# Patient Record
Sex: Female | Born: 1950 | Race: Black or African American | Hispanic: No | Smoking: Never smoker
Health system: Southern US, Community
[De-identification: ages and names within clinical notes are randomized; demographics above are authoritative.]

## PROBLEM LIST (undated history)

## (undated) DIAGNOSIS — I639 Cerebral infarction, unspecified: Secondary | ICD-10-CM

## (undated) DIAGNOSIS — A523 Neurosyphilis, unspecified: Secondary | ICD-10-CM

## (undated) HISTORY — PX: TUBAL LIGATION: SHX77

---

## 2013-10-17 ENCOUNTER — Inpatient Hospital Stay: Payer: Self-pay | Admitting: Internal Medicine

## 2013-10-17 LAB — PROTIME-INR
INR: 1.1
Prothrombin Time: 14.6 secs (ref 11.5–14.7)

## 2013-10-17 LAB — CBC WITH DIFFERENTIAL/PLATELET
Basophil #: 0.1 10*3/uL (ref 0.0–0.1)
Basophil %: 0.9 %
Eosinophil %: 1.1 %
HCT: 35.1 % (ref 35.0–47.0)
HGB: 11.6 g/dL — ABNORMAL LOW (ref 12.0–16.0)
Lymphocyte #: 2.5 10*3/uL (ref 1.0–3.6)
Lymphocyte %: 21.2 %
MCH: 29.2 pg (ref 26.0–34.0)
Monocyte #: 0.7 x10 3/mm (ref 0.2–0.9)
Monocyte %: 5.8 %
Neutrophil #: 8.3 10*3/uL — ABNORMAL HIGH (ref 1.4–6.5)
Neutrophil %: 71 %
RDW: 13.3 % (ref 11.5–14.5)
WBC: 11.7 10*3/uL — ABNORMAL HIGH (ref 3.6–11.0)

## 2013-10-17 LAB — URINALYSIS, COMPLETE
Bilirubin,UR: NEGATIVE
Ph: 7 (ref 4.5–8.0)
Protein: NEGATIVE
RBC,UR: 35 /HPF (ref 0–5)
Squamous Epithelial: 11
WBC UR: 98 /HPF (ref 0–5)

## 2013-10-17 LAB — BASIC METABOLIC PANEL
BUN: 13 mg/dL (ref 7–18)
Chloride: 105 mmol/L (ref 98–107)
Creatinine: 0.71 mg/dL (ref 0.60–1.30)
EGFR (Non-African Amer.): >60
Glucose: 105 mg/dL — ABNORMAL HIGH (ref 65–99)
Potassium: 3.8 mmol/L (ref 3.5–5.1)
Sodium: 139 mmol/L (ref 136–145)

## 2013-10-17 LAB — APTT: Activated PTT: 25.9 secs (ref 23.6–35.9)

## 2013-10-18 LAB — COMPREHENSIVE METABOLIC PANEL
Alkaline Phosphatase: 48 U/L
Anion Gap: 7 (ref 7–16)
BUN: 11 mg/dL (ref 7–18)
Chloride: 108 mmol/L — ABNORMAL HIGH (ref 98–107)
Creatinine: 0.55 mg/dL — ABNORMAL LOW (ref 0.60–1.30)
Potassium: 3.9 mmol/L (ref 3.5–5.1)
SGOT(AST): 14 U/L — ABNORMAL LOW (ref 15–37)
Sodium: 141 mmol/L (ref 136–145)
Total Protein: 6.2 g/dL — ABNORMAL LOW (ref 6.4–8.2)

## 2013-10-18 LAB — LIPID PANEL
Ldl Cholesterol, Calc: 53 mg/dL (ref 0–100)
VLDL Cholesterol, Calc: 6 mg/dL (ref 5–40)

## 2013-10-18 LAB — CBC WITH DIFFERENTIAL/PLATELET
Eosinophil #: 0.1 10*3/uL (ref 0.0–0.7)
Eosinophil %: 1.1 %
HCT: 30 % — ABNORMAL LOW (ref 35.0–47.0)
Lymphocyte #: 2 10*3/uL (ref 1.0–3.6)
MCH: 29.6 pg (ref 26.0–34.0)
MCHC: 33.8 g/dL (ref 32.0–36.0)
MCV: 88 fL (ref 80–100)
Monocyte #: 0.6 x10 3/mm (ref 0.2–0.9)
Neutrophil #: 6.5 10*3/uL (ref 1.4–6.5)
Platelet: 258 10*3/uL (ref 150–440)
RDW: 13.5 % (ref 11.5–14.5)
WBC: 9.2 10*3/uL (ref 3.6–11.0)

## 2013-10-23 ENCOUNTER — Encounter: Payer: Self-pay | Admitting: Neurology

## 2015-03-08 NOTE — Discharge Summary (Signed)
PATIENT NAME:  Autumn Pruitt, Haillie MR#:  [id number] DATE OF BIRTH:  1951/09/16  DATE OF ADMISSION:  10/17/2013 DATE OF DISCHARGE:  10/20/2013  DISCHARGE DIAGNOSES: 1.  Acute cerebrovascular accident, status post TPA.  2.  Urinary tract infection.   DISCHARGE MEDICATIONS: 1.  Aspirin 81 mg daily. 2.  Atorvastatin 40 mg by mouth daily.  3.  Cipro 500 mg every 12 hours for 10 days.   CONSULTATIONS:  Neurology consult, Dr. Sherryll BurgerShah.  Physical therapy consult and speech therapy consult.   HOSPITAL COURSE:  This is a 64 year old African American with no past medical history came in because of left upper extremity weakness, numbness, speech difficulty and facial weakness which started at 3:00 p.m. at work today.  The patient had a stroke scale of 5 and the patient received TPA in the Emergency Room and admitted to ICU.  Initial CAT scan did not show any hemorrhage.  The patient was monitored in the ICU for 24 hours.  The patient's symptoms nicely improved off the TPA.  The patient's repeat CAT scan is done because she complained of headache, but CT head did not show any hemorrhage.  The patient was started on aspirin after 24 hours and the patient also had an echocardiogram, MRI of the brain along with carotid ultrasound and lipid panel.  The patient has slight slurred speech and mild left-sided weakness.  The patient's EKG showed normal sinus rhythm 83 beats per minute.  EKG is not showing any atrial fibrillation.  The patient's LDL 53 and cholesterol total 127, creatinine is 0.55.  She had a carotid ultrasound which showed minimal plaque in the left carotid bulb.  No evidence of hemodynamically significant stenosis.  MRI of the brain without contrast showed no acute infarct.  No hemorrhage.  MRA showed plaque-like narrowing superior margin of the mid to distal MI segment of the right middle cerebral artery.  Carotid ultrasound as I mentioned.  Echocardiogram showed EF more than 60%, normal LV function, normal  ventricular wall thickness.  The patient received speech therapy while in the hospital along with physical therapy.  The patient got an appointment to receive speech therapy for one time as an outpatient and also outpatient physical therapy setup.  The patient had UTI on UA on admission so started on Cipro and the patient also seen by the physical therapy and occupation therapy.  The patient went home in stable condition.    DISCHARGE VITAL SIGNS:  Temperature 98 Fahrenheit, heart rate 82, blood pressure 117/60 with sats 98% on room air.   Time spent on discharging the patient more than 30 minutes.   The patient advised to follow up with neurologist Dr. Cristopher PeruHemang Shah.   ____________________________ Katha HammingSnehalatha Glennis Borger, MD sk:ea D: 10/27/2013 23:42:50 ET T: 10/28/2013 00:25:45 ET JOB#: [id number]  cc: Katha HammingSnehalatha Safir Michalec, MD, <Dictator> Katha HammingSNEHALATHA Auron Tadros MD ELECTRONICALLY SIGNED 10/30/2013 13:48

## 2015-03-08 NOTE — Consult Note (Signed)
Referring Physician:  Dustin Flock H :   Reason for Consult: Admit Date: 17-Oct-2013  Chief Complaint: left sided weakness   History of Present Illness: History of Present Illness:   Autumn Pruitt had acute onset of numbness of lt hand with weakness at 3 pm today. Pt was at work - called family member - who brought pt to ER. Family also has noted left face weakness. pt was not able to walk (family mentioned - she had inverted feet in car), pt very anxious and nervous. No recent dehydration, head trauma, social change, TIA. No h/o migraine with aura or use of OC pills. at bedside 5. I personally reviewed CT head - neg for hemorrhage, no other contraindication for tPA - disucssed risk and decided to give tPA.  ROS:  General denies complaints   HEENT no complaints   Lungs no complaints   Cardiac no complaints   GI no complaints   GU no complaints   Musculoskeletal no complaints   Extremities no complaints   Skin no complaints   Neuro numbness/tingling  left hand weakness   Endocrine no complaints   Psych anxious, nervous and tremulous   Past Medical/Surgical Hx:  anemia:   tubal ligation:   Past Medical/ Surgical Hx:  Past Medical History neg   Past Surgical History neg   Allergies:  No Known Allergies:   Allergies:  Allergies NKDA   Social/Family History: Social History: Does not smoke. Does not drink. No drugs, lives byherslef, works at a [employer] in [place]  Family History: There is a history of stroke in her daughter.   Vital Signs: **Vital Signs.:   02-Dec-14 19:54  Vital Signs Type Admission  Temperature Temperature (F) 98.7  Celsius 37  Pulse Pulse 90  Respirations Respirations 22  Systolic BP Systolic BP 858  Diastolic BP (mmHg) Diastolic BP (mmHg) 78  Mean BP 89  Pulse Ox % Pulse Ox % 85  Pulse Ox Activity Level  At rest  Oxygen Delivery Room Air/ 21 %  Pulse Ox Heart Rate 86   Physical Exam: General: Well developed,  HEENT:  Normocephalic, atraumatic, no lesions or abnormalities noted.  Neck: Supple with full ROM; no masses, nodes or bruits;  Chest: Clear to auscultation bilaterally with good air movement.  Normal respiratory effort.  No wheezing or rhonchi.  Cardiac: Regular rate and rhythm without murmur, rub, or gallop.  Extremities: No clubbing, cyanosis, or edema.   EXAM: Fundoscopic exam through undilated pupils was OK  Neurological Exam      Mental Status:      Alert, very anxious, tremulous,     Oriented to time, place, person and situation     Attention span and concentration seemed appropriate     Memory seemed OK.     Intact Comprehension but has some decreased fluency (was not able to read NIHSS cards)     Followed 2 step commands - mild dysarthria     Fund of knowledge seemed appropriate for age and health status.       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      Finger rub  was heard symmetric in both ears      Palate and uvular movements are normal and oral sensations are OK      Neck muscle strength and shoulder shrug is normal      Tongue protrusion and uvular elevation are normal       Motor Exam:      Tone is normal in all extremities left UE - hand had cortical weakness, apraxia, +ve pronator drift.      No abnormal movements, fasciculations or atrophy seen       Deep Tendon Reflexes:      symmetric 2 +      Right Toes are down going,  Left Toes are down going            Sensory Exam:      Sensations were intact to light touch in all extremities except left UE - numbess            Co-ordination:      Finger to nose is normal            Gait:      Gait and station are normal      Romberg's test is  negative.  Lab Results: Routine Chem:  02-Dec-14 16:30   Glucose, Serum  105  BUN 13  Creatinine (comp) 0.71  Sodium, Serum 139  Potassium, Serum 3.8  Chloride, Serum 105  CO2, Serum 28  Calcium (Total), Serum 9.5  Anion Gap  6  Osmolality (calc) 278  eGFR (African American) >60  eGFR (Non-African American) >60 (eGFR values <27m/min/1.73 m2 may be an indication of chronic kidney disease (CKD). Calculated eGFR is useful in patients with stable renal function. The eGFR calculation will not be reliable in acutely ill patients when serum creatinine is changing rapidly. It is not useful in  patients on dialysis. The eGFR calculation may not be applicable to patients at the low and high extremes of body sizes, pregnant women, and vegetarians.)  Cardiac:  02-Dec-14 16:30   Troponin I < 0.02 (0.00-0.05 0.05 ng/mL or less: NEGATIVE  Repeat testing in 3-6 hrs  if clinically indicated. >0.05 ng/mL: POTENTIAL  MYOCARDIAL INJURY. Repeat  testing in 3-6 hrs if  clinically indicated. NOTE: An increase or decrease  of 30% or more on serial  testing suggests a  clinically important change)  Routine Coag:  02-Dec-14 16:30   Prothrombin 14.6  INR 1.1 (INR reference interval applies to patients on anticoagulant therapy. A single INR therapeutic range for coumarins is not optimal for all indications; however, the suggested range for most indications is 2.0 - 3.0. Exceptions to the INR Reference Range may include: Prosthetic heart valves, acute myocardial infarction, prevention of myocardial infarction, and combinations of aspirin and anticoagulant. The need for a higher or lower target INR must be assessed individually. Reference: The Pharmacology and Management of the Vitamin K  antagonists: the seventh ACCP Conference on Antithrombotic and Thrombolytic Therapy. CAJGOT.1572Sept:126 (3suppl): [id number] A HCT value >55% may artifactually increase the PT.  In one study,  the increase  was an average of 25%. Reference:  "Effect on Routine and Special Coagulation Testing Values of Citrate Anticoagulant Adjustment in Patients with High HCT Values." American Journal of Clinical Pathology 2006;126:400-405.)  Activated PTT (APTT) 25.9 (A HCT value >55% may artifactually increase the APTT. In one study, the increase was an average of 19%. Reference: "Effect on Routine and Special Coagulation Testing Values of Citrate Anticoagulant Adjustment in Patients with High HCT  Values." American Journal of Clinical Pathology 5284;132:440-102.)  Routine Hem:  02-Dec-14 16:30   WBC (CBC)  11.7  RBC (CBC) 3.99  Hemoglobin (CBC)  11.6  Hematocrit (CBC) 35.1  Platelet Count (CBC) 322  MCV 88  MCH 29.2  MCHC 33.1  RDW 13.3  Neutrophil % 71.0  Lymphocyte % 21.2  Monocyte % 5.8  Eosinophil % 1.1  Basophil % 0.9  Neutrophil #  8.3  Lymphocyte # 2.5  Monocyte # 0.7  Eosinophil # 0.1  Basophil # 0.1 (Result(s) reported on 17 Oct 2013 at 04:47PM.)   Radiology Results: CT:    02-Dec-14 17:15, CT Head Without Contrast  CT Head Without Contrast   REASON FOR EXAM:    left arm numbness, slurred speech  COMMENTS:       PROCEDURE: CT  - CT HEAD WITHOUT CONTRAST  - Oct 17 2013  5:15PM     CLINICAL DATA:  Left arm numbness, slurred speech    EXAM:  CT HEAD WITHOUT CONTRAST    TECHNIQUE:  Contiguousaxial images were obtained from the base of the skull  through the vertex without intravenous contrast.    COMPARISON:  None.  FINDINGS:  No skull fracture is noted. Paranasal sinuses and mastoid air cells  are unremarkable.    No intracranial hemorrhage, mass effect or midline shift. A  partially calcified subcutaneous nodule is noted in right parietal  region high convexity. Measures 1.4 cm. May represent a partially  calcified sebaceous cyst. Clinical correlation is necessary.    No acute cortical infarction. No intracranial mass lesion is noted  on this unenhanced scan.      IMPRESSION:  No acute intracranial abnormality.  Electronically Signed    By: Lahoma Crocker M.D.    On: 10/17/2013 17:17         Verified By: Ephraim Hamburger, M.D.,   Impression/Recommendations: Recommendations:   64 yo AAF with no significant medical history - developed acute left UE weakness/numbness, speech difficulty, left facial weakness, slurred speech and difficulty walking at 3 pm at work today.CT head reviewed - no hemorrhage, no early signs of ischemia or dense MCA sign.NIHSS 5 ( 1 - mild face weakness, 1 left UE weakness, 1 left UE sensory, 1 aphasia, 1 dysarthria). Pt has baseline right eye exotropia.anxious - tremulousdiscussed tPA - family and patient understands the risk but I believe benifit is more - we decided to give it to her.pt will be admitted to ICU per protocol per hospitalist service, i will follow. Avoid non-compressible lines other invasive procedures if possible.Stroke: Previous antiplatelet agent - NONE - after 24 hr start ASA 81 mg po qday.vessel ultrasound (carotid and vertebrals):monitoring: to look for arrythmia as potential cause of infractlipid profile:HgA1c:swallow eval. avoid extreme  hypo/hyper tension and gentle BP reducation in peristroke period to maintain perfusion in ischemic penumbra. (160/90 with MAP 110. Consider using short acting meds with short half life, avoid NTG and nitropruside due to chance of increasing intracranial pressure)treat fever early (hyperthermia impairs stroke recovery), aggressive PT/OT/ST, early bedside swallow evaluation. Consider statin  (Lipid lowering and non lipid lowering effects of statin are useful in secondary stroke prevention. - atorvastatin 40 mg/dayDVT prophylaxis Consider flu vaccine and multivitamines.  spent total 45 min for this critically ill pt with life threatening/limb threatening severe neurological condition (including tPA decision), more than 50% of the time was for counselling and co-ordination of the care.    will follow.  Electronic Signatures: Autumn Pruitt (MD)  (  Signed 02-Dec-14 20:27)  Authored: REFERRING PHYSICIAN, Consult, History of Present Illness, Review of Systems, PAST MEDICAL/SURGICAL HISTORY, ALLERGIES, Social/Family History, NURSING VITAL SIGNS, Physical Exam-, LAB RESULTS, RADIOLOGY RESULTS, Recommendations   Last Updated: 02-Dec-14 20:27 by Autumn Pruitt (MD)

## 2015-03-08 NOTE — H&P (Signed)
PATIENT NAME:  Autumn Pruitt, Autumn Pruitt MR#:  [id number] DATE OF BIRTH:  09/30/1951  DATE OF ADMISSION:  10/17/2013  PRIMARY CARE PROVIDER: None.   REFERRING PHYSICIAN: Dr. Sharma Covert.   CHIEF COMPLAINT: Left upper extremity weakness, numbness, slurred speech, as well as left facial weakness.   HISTORY OF PRESENT ILLNESS: The patient is a 64 year old African American female with no prior medical history who presents with symptoms of left-sided facial weakness, speech difficulty, left upper extremity weakness and numbness that started at 3 p.m. at work today. The patient came to the ED and had a CT scan of the head which was negative for hemorrhage. No early signs of ischemia or any other abnormality. At that point, a stat neurology evaluation was done. Was seen by Dr. Sherryll Burger and he put her on NIH scale of 5. TPA was discussed with the patient, and she was given TPA and now we are asked to admit the patient. The patient reports some of the symptoms have improved very minimally. She was also very anxious and continues to be anxious and tremulous. She reports that she has never had a TIA or a stroke in the past and is not taking any medications or has any medical problems.   PAST MEDICAL HISTORY: Significant for none.    PAST SURGICAL HISTORY: None according to her.   ALLERGIES: None.   MEDICATIONS: She is not on any medications.   SOCIAL HISTORY: Does not smoke. Does not drink. No drugs.   FAMILY HISTORY: There is a history of stroke in her daughter.   REVIEW OF SYSTEMS:   CONSTITUTIONAL: Denies any fevers, fatigue, weakness, pain, weight loss or weight gain.  EYES: No blurred or double vision. No pain. No redness. No inflammation. No glaucoma. No cataracts.  ENT: No tinnitus. No ear pain. No hearing loss. No seasonal or year-round allergies. No epistaxis. No discharge. No postnasal drip. No sinus pain. No difficulty swallowing.  RESPIRATORY: Denies any cough, wheezing, hemoptysis. No dyspnea.   CARDIOVASCULAR: Denies any chest pain, orthopnea, edema or arrhythmia. No palpitations. No syncope.  GASTROINTESTINAL: No nausea, vomiting, diarrhea. No abdominal pain. No hematemesis. No melena.  GENITOURINARY: Denies any dysuria, hematuria, renal calculus or frequency.  ENDOCRINE: Denies any polyuria, nocturia or thyroid problems.  HEMATOLOGIC AND LYMPHATIC: Denies anemia, easy bruisability or bleeding.  SKIN: No acne. No rash. No changes in mole, hair or skin.  MUSCULOSKELETAL: Denies any pain in the neck, back or shoulder.  NEUROLOGIC: No  seizures  No CVA. No TIA previously.  PSYCHIATRIC: No axniety depression  insomnia or ADD.   PHYSICAL EXAMINATION:  VITAL SIGNS: Temperature 98.5, pulse 81, respirations 18, blood pressure 134/67, O2 98%.  GENERAL: The patient is an anxious female.  HEENT: Head atraumatic, normocephalic. Pupils equally round, react to light and accommodation. She does have right eye exotropia which limits evaluation of her extraocular movements. There is no conjunctival pallor. No scleral icterus. Nasal exam shows no drainage or ulceration. Oropharynx is clear without any exudate.  NECK: Supple without any JVD.  CARDIOVASCULAR: Regular rate and rhythm. No murmurs, rubs, clicks or gallops. PMI is not displaced.  LUNGS: Clear to auscultation bilaterally without any rales, rhonchi, wheezing.  ABDOMEN: Soft, nontender, nondistended. Positive bowel sounds x 4. No hepatosplenomegaly.  GENITOURINARY: Deferred.  MUSCULOSKELETAL: There is no erythema or swelling.  SKIN: No rash.  LYMPHATICS: No lymph nodes palpable.  VASCULAR: Good DP, PT pulses.  PSYCHIATRIC: A little anxious.  NEUROLOGIC: She has a mild asymmetry to her left  face. Diminished sensation to the left face. The rest of the cranial nerves are normal. Strength is diminished in the left upper and left lower extremities. It is 4 out of 5. Reflexes are hyperreflexic on the left side. Sensation intact in the extremity.    EVALUATIONS: In the ED, glucose 105, BUN 13, creatinine 0.71, sodium 139, potassium 3.8, chloride 104, CO2 is 28, calcium 9.5. Troponin less than 0.02. WBC 11.7, hemoglobin 11.6, platelet count 322. EKG showed normal sinus rhythm without any ST-T wave changes. CT scan of the head shows no acute intracranial abnormalities.   ASSESSMENT AND PLAN: The patient is a 64 year old [place] female with no medical history. Developed acute left upper extremity weakness, numbness, left facial weakness. Thought to have acute cerebrovascular accident with [employer] Score of 5. Was seen by neurology earlier.  1. Acute cerebrovascular accident: At this time, she has received TPA. Will admit the patient for further evaluation to the intensive care unit in light of receiving TPA. Will monitor her very closely. The patient will not be placed on any antiplatelets. This will need to be started tomorrow. We will start her on pravastatin. We will get a swallow evaluation. Physical therapy and occupational evaluation. Will get carotid Dopplers. Will get echocardiogram of the heart. Will get an MRI of the brain. Will also allow permissive hypertension unless her blood pressure is extremely high.  2. In light of receiving the TPA, will hold off on Lovenox for deep vein thrombosis prophylaxis. She will be on sequential compression devices.   TOTAL TIME SPENT ON THIS PATIENT: 55 minutes.   ____________________________ Lacie ScottsShreyang H. Allena KatzPatel, MD shp:gb D: 10/17/2013 19:01:42 ET T: 10/17/2013 19:16:56 ET JOB#: [id number]  cc: Akiem Urieta H. Allena KatzPatel, MD, <Dictator> Charise CarwinSHREYANG H Amaree Leeper MD ELECTRONICALLY SIGNED 10/27/2013 12:46

## 2015-03-08 NOTE — Consult Note (Signed)
Brief Consult Note: Diagnosis: code stroke.   Patient was seen by consultant.   Consult note dictated.   Comments: - STAT neurology consult was called in this 64 yo AAF with no significant medical history - developed acute left UE weakness/numbness, speech difficulty, left facial weakness, slurred speech and difficulty walking at 3 pm at work today. - CT head reviewed - no hemorrhage, no early signs of ischemia or dense MCA sign. - NIHSS 5 ( 1 - mild face weakness, 1 left UE weakness, 1 left UE sensory, 1 aphasia, 1 dysarthria). Pt has baseline right eye exotropia. - anxious - tremulous - discussed tPA - family and patient understands the risk but I believe benifit is more - we decided to give it to her. - pt will be admitted to ICU per protocol per hospitalist service, i will follow.  - Avoid non-compressible lines other invasive procedures if possible. - Stroke: Previous antiplatelet agent - NONE - after 24 hr start ASA 81 mg po qday. ECHO: Extracranial vessel ultrasound (carotid and vertebrals): Telemtry monitoring: to look for arrythmia as potential cause of infract Fasting lipid profile: UDS, HgA1c: - swallow eval.  Treatment:  - avoid extreme  hypo/hyper tension and gentle BP reducation in peristroke period to maintain perfusion in ischemic penumbra. (160/90 with MAP 110. Consider using short acting meds with short half life, avoid NTG and nitropruside due to chance of increasing intracranial pressure) - treat fever early (hyperthermia impairs stroke recovery),  - aggressive PT/OT/ST, early bedside swallow evaluation.  - Consider statin  (Lipid lowering and non lipid lowering effects of statin are useful in secondary stroke prevention. - atorvastatin 40 mg/day - DVT prophylaxis  - Consider flu vaccine and multivitamines.    - will follow.  Electronic Signatures: Jolene ProvostShah, Himani Corona Kalpeshkumar (MD)  (Signed 02-Dec-14 17:50)  Authored: Brief Consult Note   Last Updated: 02-Dec-14  17:50 by Jolene ProvostShah, Richy Spradley Kalpeshkumar (MD)

## 2015-05-25 ENCOUNTER — Emergency Department: Payer: Self-pay

## 2015-05-25 ENCOUNTER — Emergency Department
Admission: EM | Admit: 2015-05-25 | Discharge: 2015-05-25 | Disposition: A | Discharge status: Home / Self Care | Payer: Self-pay | Attending: Emergency Medicine | Admitting: Emergency Medicine

## 2015-05-25 ENCOUNTER — Encounter: Payer: Self-pay | Admitting: Emergency Medicine

## 2015-05-25 ENCOUNTER — Other Ambulatory Visit: Payer: Self-pay

## 2015-05-25 DIAGNOSIS — E876 Hypokalemia: Secondary | ICD-10-CM | POA: Insufficient documentation

## 2015-05-25 DIAGNOSIS — N390 Urinary tract infection, site not specified: Secondary | ICD-10-CM | POA: Insufficient documentation

## 2015-05-25 DIAGNOSIS — G4700 Insomnia, unspecified: Secondary | ICD-10-CM | POA: Insufficient documentation

## 2015-05-25 HISTORY — DX: Cerebral infarction, unspecified: I63.9

## 2015-05-25 LAB — URINALYSIS COMPLETE WITH MICROSCOPIC (ARMC ONLY)
Bilirubin Urine: NEGATIVE
Glucose, UA: NEGATIVE mg/dL
Nitrite: NEGATIVE
Protein, ur: NEGATIVE mg/dL
SPECIFIC GRAVITY, URINE: 1.008 (ref 1.005–1.030)
pH: 5 (ref 5.0–8.0)

## 2015-05-25 LAB — COMPREHENSIVE METABOLIC PANEL
ALK PHOS: 46 U/L (ref 38–126)
ALT: 15 U/L (ref 14–54)
ANION GAP: 10 (ref 5–15)
AST: 23 U/L (ref 15–41)
Albumin: 4.4 g/dL (ref 3.5–5.0)
BILIRUBIN TOTAL: 0.7 mg/dL (ref 0.3–1.2)
BUN: 10 mg/dL (ref 6–20)
CHLORIDE: 105 mmol/L (ref 101–111)
CO2: 27 mmol/L (ref 22–32)
CREATININE: 0.7 mg/dL (ref 0.44–1.00)
Calcium: 9.4 mg/dL (ref 8.9–10.3)
GFR calc Af Amer: >60 mL/min (ref >60)
GFR calc non Af Amer: >60 mL/min (ref >60)
Glucose, Bld: 111 mg/dL — ABNORMAL HIGH (ref 65–99)
POTASSIUM: 2.9 mmol/L — AB (ref 3.5–5.1)
Sodium: 142 mmol/L (ref 135–145)
Total Protein: 7.8 g/dL (ref 6.5–8.1)

## 2015-05-25 LAB — CBC
HCT: 35.5 % (ref 35.0–47.0)
Hemoglobin: 11.9 g/dL — ABNORMAL LOW (ref 12.0–16.0)
MCH: 29.5 pg (ref 26.0–34.0)
MCHC: 33.6 g/dL (ref 32.0–36.0)
MCV: 87.8 fL (ref 80.0–100.0)
PLATELETS: 289 10*3/uL (ref 150–440)
RBC: 4.05 MIL/uL (ref 3.80–5.20)
RDW: 14.3 % (ref 11.5–14.5)
WBC: 8.2 10*3/uL (ref 3.6–11.0)

## 2015-05-25 MED ORDER — LORAZEPAM 1 MG PO TABS
ORAL_TABLET | ORAL | Status: AC
  Filled 2015-05-25: qty 1

## 2015-05-25 MED ORDER — ASPIRIN EC 81 MG PO TBEC: 81.0000 mg | DELAYED_RELEASE_TABLET | Freq: Every day | ORAL | Status: AC

## 2015-05-25 MED ORDER — POTASSIUM CHLORIDE 20 MEQ/15ML (10%) PO SOLN
40.0000 meq | Freq: Once | ORAL | Status: DC
  Filled 2015-05-25: qty 30

## 2015-05-25 MED ORDER — RISPERIDONE 1 MG PO TABS: 0.5000 mg | ORAL_TABLET | Freq: Two times a day (BID) | ORAL | Status: DC

## 2015-05-25 MED ORDER — CEPHALEXIN 500 MG PO CAPS: 500.0000 mg | ORAL_CAPSULE | Freq: Four times a day (QID) | ORAL | Status: DC

## 2015-05-25 MED ORDER — CEFTRIAXONE SODIUM 1 G IJ SOLR
INTRAMUSCULAR | Status: AC
  Filled 2015-05-25: qty 10

## 2015-05-25 MED ORDER — POTASSIUM CHLORIDE CRYS ER 20 MEQ PO TBCR: 40.0000 meq | EXTENDED_RELEASE_TABLET | Freq: Once | ORAL | Status: DC

## 2015-05-25 MED ORDER — TRAZODONE HCL 50 MG PO TABS: 50.0000 mg | ORAL_TABLET | Freq: Every day | ORAL | Status: DC

## 2015-05-25 MED ORDER — CEFTRIAXONE SODIUM IN DEXTROSE 20 MG/ML IV SOLN
1.0000 g | INTRAVENOUS | Status: DC
Start: 2015-05-25 — End: 2015-05-25

## 2015-05-25 MED ORDER — ASPIRIN 81 MG PO CHEW
81.0000 mg | CHEWABLE_TABLET | Freq: Once | ORAL | Status: AC
  Administered 2015-05-25: 81 mg via ORAL

## 2015-05-25 MED ORDER — ASPIRIN 81 MG PO CHEW
CHEWABLE_TABLET | ORAL | Status: AC
  Filled 2015-05-25: qty 1

## 2015-05-25 MED ORDER — POTASSIUM CHLORIDE 20 MEQ PO PACK
PACK | ORAL | Status: AC
  Administered 2015-05-25: 40 meq
  Filled 2015-05-25: qty 2

## 2015-05-25 MED ORDER — LORAZEPAM 1 MG PO TABS
1.0000 mg | ORAL_TABLET | Freq: Once | ORAL | Status: AC
  Administered 2015-05-25: 1 mg via ORAL

## 2015-05-25 MED ORDER — POTASSIUM CHLORIDE CRYS ER 20 MEQ PO TBCR
EXTENDED_RELEASE_TABLET | ORAL | Status: AC
  Filled 2015-05-25: qty 2

## 2015-05-25 MED ORDER — POTASSIUM CHLORIDE ER 10 MEQ PO TBCR: 10.0000 meq | EXTENDED_RELEASE_TABLET | Freq: Two times a day (BID) | ORAL | Status: DC

## 2015-05-25 MED ORDER — CEFTRIAXONE SODIUM 1 G IJ SOLR
1.0000 g | Freq: Once | INTRAMUSCULAR | Status: AC
  Administered 2015-05-25: 1 g via INTRAMUSCULAR

## 2015-05-25 NOTE — ED Notes (Signed)
Patient given lunch tray to eat.

## 2015-05-25 NOTE — ED Notes (Signed)
Patient to ED via EMS with c/o insomina and altered mental status. Per EMS, patient neighbor called 911 stating patient has been acting " different". Per EMS patient has been calling the police on children in the neighborhood playing basketball. Patient is anxious at assessment. 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 Patient is alert. Patient is able to follow instructions and answer questions. Patient denies HI/ SI. Patient is unable to state how many days she has been without sleep. Patient states she has called to police on the children playing basketball due to " they are loud, that's all I hear".

## 2015-05-25 NOTE — [facility] Counselor (Signed)
Dr. Guss Bundehalla asked writer to call Pt's daughter for assistance with Psych Exam; spoke with Pt's daughter by telephone who stated that she was en route to the hospital and would meet staff in her mother's room

## 2015-05-25 NOTE — ED Provider Notes (Addendum)
-----------------------------------------   5:08 PM on 05/25/2015 -----------------------------------------   BP 128/104 mmHg  Pulse 87  Temp(Src) 98 F (36.7 C) (Oral)  Resp 17  Ht 5\' 9"  (1.753 m)  Wt 130 lb (58.968 kg)  BMI 19.19 kg/m2  SpO2 100%  Dr. Guss Bundehalla evaluated the patient in person and feel she is appropriate for outpatient follow-up.  She recommended trazodone 50 mg daily at bedtime and Risperdal 0.5 mg twice a day.  Additionally, I am treating her for her urinary tract infection which may help with her mental status.  The patient and her daughter agree that these steps should help and the patient will follow up with her outpatient doctor.  I am going to discharge her for outpatient follow-up.  I gave my usual and customary return precautions.  I discussed this plan with the patient and her daughter and they both understand and agree.   New Prescriptions   ASPIRIN EC 81 MG TABLET    Take 1 tablet (81 mg total) by mouth daily.   CEPHALEXIN (KEFLEX) 500 MG CAPSULE    Take 1 capsule (500 mg total) by mouth 4 (four) times daily.   POTASSIUM CHLORIDE (K-DUR) 10 MEQ TABLET    Take 1 tablet (10 mEq total) by mouth 2 (two) times daily.   RISPERIDONE (RISPERDAL) 1 MG TABLET    Take 0.5 tablets (0.5 mg total) by mouth 2 (two) times daily.   TRAZODONE (DESYREL) 50 MG TABLET    Take 1 tablet (50 mg total) by mouth at bedtime.     Loleta Roseory Iliany Losier, MD 05/25/15 [phone]

## 2015-05-25 NOTE — Discharge Instructions (Signed)
Please note that because of your history of stroke, it is important that you stay on a baby aspirin daily. We noted that you are longer taking any medications, this is very important to prevent stroke as you have a previous history of stroke. Please follow-up with your doctor right away to reestablish care and obtain close follow-up.  As we discussed, the psychiatrist recommended that you take the prescribed medications (risperidone and trazodone) as prescribed to help with your sleeping situation.  We have also prescribed for you and antibiotics (cephalexin) for urinary tract infection.  We also prescribed a few tablets of potassium supplement.  You should definitely follow up with your doctor as soon as possible.  Return to the emergency room right away should you develop confusion, headache, fever, chills, vomiting, feel you may pass out, be at risk of hurting herself or others become very anxious other new concerns arise. Insomnia Insomnia is frequent trouble falling and/or staying asleep. Insomnia can be a long term problem or a short term problem. Both are common. Insomnia can be a short term problem when the wakefulness is related to a certain stress or worry. Long term insomnia is often related to ongoing stress during waking hours and/or poor sleeping habits. Overtime, sleep deprivation itself can make the problem worse. Every little thing feels more severe because you are overtired and your ability to cope is decreased. CAUSES   Stress, anxiety, and depression.  Poor sleeping habits.  Distractions such as TV in the bedroom.  Naps close to bedtime.  Engaging in emotionally charged conversations before bed.  Technical reading before sleep.  Alcohol and other sedatives. They may make the problem worse. They can hurt normal sleep patterns and normal dream activity.  Stimulants such as caffeine for several hours prior to bedtime.  Pain syndromes and shortness of breath can cause  insomnia.  Exercise late at night.  Changing time zones may cause sleeping problems (jet lag). It is sometimes helpful to have someone observe your sleeping patterns. They should look for periods of not breathing during the night (sleep apnea). They should also look to see how long those periods last. If you live alone or observers are uncertain, you can also be observed at a sleep clinic where your sleep patterns will be professionally monitored. Sleep apnea requires a checkup and treatment. Give your caregivers your medical history. Give your caregivers observations your family has made about your sleep.  SYMPTOMS   Not feeling rested in the morning.  Anxiety and restlessness at bedtime.  Difficulty falling and staying asleep. TREATMENT   Your caregiver may prescribe treatment for an underlying medical disorders. Your caregiver can give advice or help if you are using alcohol or other drugs for self-medication. Treatment of underlying problems will usually eliminate insomnia problems.  Medications can be prescribed for short time use. They are generally not recommended for lengthy use.  Over-the-counter sleep medicines are not recommended for lengthy use. They can be habit forming.  You can promote easier sleeping by making lifestyle changes such as:  Using relaxation techniques that help with breathing and reduce muscle tension.  Exercising earlier in the day.  Changing your diet and the time of your last meal. No night time snacks.  Establish a regular time to go to bed.  Counseling can help with stressful problems and worry.  Soothing music and white noise may be helpful if there are background noises you cannot remove.  Stop tedious detailed work at least one hour  before bedtime. HOME CARE INSTRUCTIONS   Keep a diary. Inform your caregiver about your progress. This includes any medication side effects. See your caregiver regularly. Take note of:  Times when you are  asleep.  Times when you are awake during the night.  The quality of your sleep.  How you feel the next day. This information will help your caregiver care for you.  Get out of bed if you are still awake after 15 minutes. Read or do some quiet activity. Keep the lights down. Wait until you feel sleepy and go back to bed.  Keep regular sleeping and waking hours. Avoid naps.  Exercise regularly.  Avoid distractions at bedtime. Distractions include watching television or engaging in any intense or detailed activity like attempting to balance the household checkbook.  Develop a bedtime ritual. Keep a familiar routine of bathing, brushing your teeth, climbing into bed at the same time each night, listening to soothing music. Routines increase the success of falling to sleep faster.  Use relaxation techniques. This can be using breathing and muscle tension release routines. It can also include visualizing peaceful scenes. You can also help control troubling or intruding thoughts by keeping your mind occupied with boring or repetitive thoughts like the old concept of counting sheep. You can make it more creative like imagining planting one beautiful flower after another in your backyard garden.  During your day, work to eliminate stress. When this is not possible use some of the previous suggestions to help reduce the anxiety that accompanies stressful situations. MAKE SURE YOU:   Understand these instructions.  Will watch your condition.  Will get help right away if you are not doing well or get worse. Document Released: 10/30/2000 Document Revised: 01/25/2012 Document Reviewed: 11/30/2007 [facility] Patient Information 2015 [place]. This information is not intended to replace advice given to you by your health care provider. Make sure you discuss any questions you have with your health care provider.   Urinary Tract Infection Urinary tract infections (UTIs) can develop anywhere  along your urinary tract. Your urinary tract is your body's drainage system for removing wastes and extra water. Your urinary tract includes two kidneys, two ureters, a bladder, and a urethra. Your kidneys are a pair of bean-shaped organs. Each kidney is about the size of your fist. They are located below your ribs, one on each side of your spine. CAUSES Infections are caused by microbes, which are microscopic organisms, including fungi, viruses, and bacteria. These organisms are so small that they can only be seen through a microscope. Bacteria are the microbes that most commonly cause UTIs. SYMPTOMS  Symptoms of UTIs may vary by age and gender of the patient and by the location of the infection. Symptoms in young women typically include a frequent and intense urge to urinate and a painful, burning feeling in the bladder or urethra during urination. Older women and men are more likely to be tired, shaky, and weak and have muscle aches and abdominal pain. A fever may mean the infection is in your kidneys. Other symptoms of a kidney infection include pain in your back or sides below the ribs, nausea, and vomiting. DIAGNOSIS To diagnose a UTI, your caregiver will ask you about your symptoms. Your caregiver also will ask to provide a urine sample. The urine sample will be tested for bacteria and white blood cells. White blood cells are made by your body to help fight infection. TREATMENT  Typically, UTIs can be treated with medication.  Because most UTIs are caused by a bacterial infection, they usually can be treated with the use of antibiotics. The choice of antibiotic and length of treatment depend on your symptoms and the type of bacteria causing your infection. HOME CARE INSTRUCTIONS  If you were prescribed antibiotics, take them exactly as your caregiver instructs you. Finish the medication even if you feel better after you have only taken some of the medication.  Drink enough water and fluids to keep  your urine clear or pale yellow.  Avoid caffeine, tea, and carbonated beverages. They tend to irritate your bladder.  Empty your bladder often. Avoid holding urine for long periods of time.  Empty your bladder before and after sexual intercourse.  After a bowel movement, women should cleanse from front to back. Use each tissue only once. SEEK MEDICAL CARE IF:   You have back pain.  You develop a fever.  Your symptoms do not begin to resolve within 3 days. SEEK IMMEDIATE MEDICAL CARE IF:   You have severe back pain or lower abdominal pain.  You develop chills.  You have nausea or vomiting.  You have continued burning or discomfort with urination. MAKE SURE YOU:   Understand these instructions.  Will watch your condition.  Will get help right away if you are not doing well or get worse. Document Released: 08/12/2005 Document Revised: 05/03/2012 Document Reviewed: 12/11/2011 [facility] Patient Information 2015 [place], [place]. This information is not intended to replace advice given to you by your health care provider. Make sure you discuss any questions you have with your health care provider.

## 2015-05-25 NOTE — Consult Note (Signed)
Autumn Pruitt   Reason for Pruitt: Follow up Referring Physician:  [facility] Patient Identification: Autumn Pruitt MRN:  [id number] Principal Diagnosis: <principal problem not specified> Diagnosis:  There are no active problems to display for this patient.   Total Time spent with patient: 1 hour  Subjective:   Autumn Pruitt is a 64 y.o. female patient admitted with a H/O CVA in 2015 and is widowed and lives by herself and has neighborhood kids that are making noises that she is not able to get a good night rest and made a complaint and came to ER for help.Marland Kitchen  HPI:  As above. Lives by herself and loves herself HPI Elements:     Past Medical History:  Past Medical History  Diagnosis Date  . CVA (cerebral infarction)    History reviewed. No pertinent past surgical history. Family History: No family history on file. Social History:  History  Alcohol Use No     History  Drug Use Not on file    History   Social History  . Marital Status: Single    Spouse Name: N/A  . Number of Children: N/A  . Years of Education: N/A   Social History Main Topics  . Smoking status: Unknown If Ever Smoked  . Smokeless tobacco: Not on file  . Alcohol Use: No  . Drug Use: Not on file  . Sexual Activity: Not on file   Other Topics Concern  . None   Social History Narrative  . None   Additional Social History:    History of alcohol / drug use?: No history of alcohol / drug abuse                     Allergies:  No Known Allergies  Labs:  Results for orders placed or performed during the hospital encounter of 05/25/15 (from the past 48 hour(s))  CBC     Status: Abnormal   Collection Time: 05/25/15 12:55 PM  Result Value Ref Range   WBC 8.2 3.6 - 11.0 K/uL   RBC 4.05 3.80 - 5.20 MIL/uL   Hemoglobin 11.9 (L) 12.0 - 16.0 g/dL   HCT 35.5 35.0 - 47.0 %   MCV 87.8 80.0 - 100.0 fL   MCH 29.5 26.0 - 34.0 pg   MCHC 33.6 32.0 - 36.0 g/dL   RDW 14.3 11.5 - 14.5 %    Platelets 289 150 - 440 K/uL  Comprehensive metabolic panel     Status: Abnormal   Collection Time: 05/25/15 12:55 PM  Result Value Ref Range   Sodium 142 135 - 145 mmol/L   Potassium 2.9 (LL) 3.5 - 5.1 mmol/L    Comment: CRITICAL RESULT CALLED TO, READ BACK BY AND VERIFIED WITH JENNIFER JONES @ 1347 ON 05/25/2015 CAF    Chloride 105 101 - 111 mmol/L   CO2 27 22 - 32 mmol/L   Glucose, Bld 111 (H) 65 - 99 mg/dL   BUN 10 6 - 20 mg/dL   Creatinine, Ser 0.70 0.44 - 1.00 mg/dL   Calcium 9.4 8.9 - 10.3 mg/dL   Total Protein 7.8 6.5 - 8.1 g/dL   Albumin 4.4 3.5 - 5.0 g/dL   AST 23 15 - 41 U/L   ALT 15 14 - 54 U/L   Alkaline Phosphatase 46 38 - 126 U/L   Total Bilirubin 0.7 0.3 - 1.2 mg/dL   GFR calc non Af Amer >60 >60 mL/min   GFR calc Af Amer >60 >60  mL/min    Comment: (NOTE) The eGFR has been calculated using the CKD EPI equation. This calculation has not been validated in all clinical situations. eGFR's persistently <60 mL/min signify possible Chronic Kidney Disease.    Anion gap 10 5 - 15  Urinalysis complete, with microscopic ([facility] only)     Status: Abnormal   Collection Time: 05/25/15  3:01 PM  Result Value Ref Range   Color, Urine YELLOW (A) YELLOW   APPearance HAZY (A) CLEAR   Glucose, UA NEGATIVE NEGATIVE mg/dL   Bilirubin Urine NEGATIVE NEGATIVE   Ketones, ur 1+ (A) NEGATIVE mg/dL   Specific Gravity, Urine 1.008 1.005 - 1.030   Hgb urine dipstick 1+ (A) NEGATIVE   pH 5.0 5.0 - 8.0   Protein, ur NEGATIVE NEGATIVE mg/dL   Nitrite NEGATIVE NEGATIVE   Leukocytes, UA 3+ (A) NEGATIVE   RBC / HPF 6-30 0 - 5 RBC/hpf   WBC, UA TOO NUMEROUS TO COUNT 0 - 5 WBC/hpf   Bacteria, UA RARE (A) NONE SEEN   Squamous Epithelial / LPF 0-5 (A) NONE SEEN   Mucous PRESENT     Vitals: Blood pressure 128/104, pulse 87, temperature 98 F (36.7 C), temperature source Oral, resp. rate 17, height _0  (1.753 m), weight 58.968 kg (130 lb), SpO2 100 %.  Risk to Self: Suicidal Ideation:  No Suicidal Intent: No Is patient at risk for suicide?: No Suicidal Plan?: No Access to Means: No What has been your use of drugs/alcohol within the last 12 months?: None reported How many times?: 0 Other Self Harm Risks: None Triggers for Past Attempts: None known Intentional Self Injurious Behavior: None Risk to Others: Homicidal Ideation: No Thoughts of Harm to Others: No Current Homicidal Intent: No Current Homicidal Plan: No Access to Homicidal Means: No Identified Victim: N/A History of harm to others?: No Assessment of Violence: None Noted Violent Behavior Description: N/A Does patient have access to weapons?: No Criminal Charges Pending?: No Does patient have a court date: No Prior Inpatient Therapy: Prior Inpatient Therapy: No Prior Therapy Dates: N/A Prior Therapy Facilty/Provider(s): N/A Reason for Treatment: N/A Prior Outpatient Therapy: Prior Outpatient Therapy: No Prior Therapy Dates: N/A Prior Therapy Facilty/Provider(s): N/A Reason for Treatment: N/A Does patient have an ACCT team?: No Does patient have Intensive In-House Services?  : No Does patient have [facility] services? : No Does patient have P4CC services?: No  Current Facility-Administered Medications  Medication Dose Route Frequency Provider Last Rate Last Dose  . cefTRIAXone (ROCEPHIN) 1 G injection           . cefTRIAXone (ROCEPHIN) injection 1 g  1 g Intramuscular Once Hinda Kehr, MD      . Derrill Memo ON 05/26/2015] cephALEXin (KEFLEX) capsule 500 mg  500 mg Oral QID Hinda Kehr, MD      . potassium chloride 20 MEQ/15ML (10%) solution 40 mEq  40 mEq Oral Once Delman Kitten, MD   40 mEq at 05/25/15 1453   Current Outpatient Prescriptions  Medication Sig Dispense Refill  . aspirin EC 81 MG tablet Take 1 tablet (81 mg total) by mouth daily. 30 tablet 1    Musculoskeletal: Strength & Muscle Tone: decreased Gait & Station: unsteady Patient leans: N/A  Psychiatric Specialty Exam: Physical Exam   Review of Systems  Constitutional: Negative.   HENT: Negative.   Eyes: Negative.   Cardiovascular: Negative.   Gastrointestinal: Negative.   Genitourinary: Negative.   Musculoskeletal: Negative.   Skin: Negative.   Neurological: Positive for  speech change and focal weakness.  Endo/Heme/Allergies: Negative.   Psychiatric/Behavioral: Negative for hallucinations. The patient has insomnia.     Blood pressure 128/104, pulse 87, temperature 98 F (36.7 C), temperature source Oral, resp. rate 17, height _0  (1.753 m), weight 58.968 kg (130 lb), SpO2 100 %.Body mass index is 19.19 kg/(m^2).  General Appearance: Casual  Eye Contact::  Fair  Speech:  Slurred  Volume:  Normal  Mood:  Anxious  Affect:  Appropriate  Thought Process:  Circumstantial  Orientation:  Full (Time, Place, and Person)  Thought Content:  WDL  Suicidal Thoughts:  No  Homicidal Thoughts:  No  Memory:  Immediate;   Fair Recent;   Fair Remote;   Fair adequate  Judgement:  Fair  Insight:  Fair  Psychomotor Activity:  Decreased  Concentration:  Fair  Recall:  [employer] of Knowledge:Fair  Language: Poor  Akathisia:  No  Handed:  Right  AIMS (if indicated):     Assets:  Communication Skills Desire for Improvement Financial Resources/Insurance Housing Social Support Transportation  ADL's:  Intact  Cognition: WNL  Sleep:      Medical Decision Making: Review of New Medication or Change in Dosage (2)  Treatment Plan Summary: Plan Discussed with ER Physician about Trazodone 50 mgs po hs for sleep and Risperidone 0.5 mgs bo bid for agitation. and D/C IVC and discharge her home with daughter and follow up with [facility]  Plan:  No evidence of imminent risk to self or others at present.   Disposition: as above.  Dewain Penning 05/25/2015 5:08 PM

## 2015-05-25 NOTE — ED Notes (Signed)
Daughter at bedside. Per daughter, patient mental status has been declining since previous CVA. Daughter says " we had a falling out, but she is still my mother. I have been trying to get her seen by a doctor but she won't go".

## 2015-05-25 NOTE — Consult Note (Signed)
  Pt is not on IVC and is here on a Voluntary basis and so the previous note that states D/C IVC is not to be considered and pt may be discharged by Er Physician.

## 2015-05-25 NOTE — ED Notes (Signed)
Fanny BienQuale, MD notified of critical lab value.

## 2015-05-25 NOTE — ED Provider Notes (Signed)
[facility] [place] Emergency Department Provider Note  ____________________________________________  Time seen: Approximately 1:21 PM  I have reviewed the triage vital signs and the nursing notes.   HISTORY  Chief Complaint Altered Mental Status    HPI Autumn Pruitt is a 64 y.o. 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 She presents today after calling 911 because of difficulty with sleeping for several days. She states she has not been able to sleep in several days because her neighbors are continuously playing basketball outside of her home at night. She reports hearing sounds of music and basketball at night which prevents her from sleeping and is "driving her crazy", but that she is "not crazy".  Her daughter is with her who also gives a history and they're concerned that she is not sleeping and acting erratically with insomnia. What is not clear to me, is that after gathering additional history whether or not they're truly are people playing basketball.  The patient denies homicidal or suicidal. She just states that she needs to sleep and has not slept in days and that is regarding her nerves.  Patient denies history of psychiatric illness. No fevers chills headache. No facial droop. No numbness or tingling. 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 No new changes.   Past Medical History  Diagnosis Date  . CVA (cerebral infarction)     There are no active problems to display for this patient.   History reviewed. No pertinent past surgical history.  No current outpatient prescriptions on file.  Allergies Review of patient's allergies indicates no known allergies.  No family history on file.  Social History History  Substance Use Topics  . Smoking status: Unknown If Ever Smoked  . Smokeless tobacco: Not on file  . Alcohol Use: No    Review of  Systems Constitutional: No fever/chills Eyes: No visual changes. ENT: No sore throat. Cardiovascular: Denies chest pain. Respiratory: Denies shortness of breath. Gastrointestinal: No abdominal pain.  No nausea, no vomiting.  No diarrhea.  No constipation. Genitourinary: Negative for dysuria. Musculoskeletal: Negative for back pain. Skin: Negative for rash. Neurological: Negative for headaches, focal weakness or numbness.  10-point ROS otherwise negative.  ____________________________________________   PHYSICAL EXAM:  VITAL SIGNS: ED Triage Vitals  Enc Vitals Group     BP 05/25/15 1226 128/104 mmHg     Pulse Rate 05/25/15 1226 87     Resp 05/25/15 1226 17     Temp 05/25/15 1226 98 F (36.7 C)     Temp Source 05/25/15 1226 Oral     SpO2 05/25/15 1226 100 %     Weight 05/25/15 1226 130 lb (58.968 kg)     Height 05/25/15 1226 5\' 9"  (1.753 m)     Head Cir --      Peak Flow --      Pain Score --      Pain Loc --      Pain Edu? --      Excl. in GC? --     Constitutional: Alert and oriented, somewhat elevated affect and seems anxious. Eyes: Conjunctivae are normal. PERRL. EOMI. Head: Atraumatic. Nose: No congestion/rhinnorhea. Mouth/Throat: Mucous membranes are moist.  Oropharynx non-erythematous. Neck: No stridor.   Cardiovascular: Normal rate, regular rhythm. Grossly normal heart sounds.  Good peripheral circulation. Respiratory: Normal respiratory effort.  No retractions. Lungs CTAB. Gastrointestinal: Soft and nontender. No distention. No  abdominal bruits. No CVA tenderness. Musculoskeletal: No lower extremity tenderness nor edema.  No joint effusions. Neurologic:  She has a slightly thick leg which, it is clear in her process. No gross focal neurologic deficits are appreciated. 5 out of 5 strength in all extremities. No facial droop. She does have a left eye easily tropia, but she and her daughter state that this is very chronic ongoing. I find no evidence for acute  stroke. Skin:  Skin is warm, dry and intact. No rash noted. Psychiatric: Mood and affect are normal. Speech and behavior are normal.  ____________________________________________   LABS (all labs ordered are listed, but only abnormal results are displayed)  Labs Reviewed  CBC - Abnormal; Notable for the following:    Hemoglobin 11.9 (*)    All other components within normal limits  COMPREHENSIVE METABOLIC PANEL - Abnormal; Notable for the following:    Potassium 2.9 (*)    Glucose, Bld 111 (*)    All other components within normal limits  URINALYSIS COMPLETEWITH MICROSCOPIC ([facility] ONLY)   ____________________________________________  EKG  ED ECG REPORT I, QUALE, MARK, the attending physician, personally viewed and interpreted this ECG.  Date: 05/25/2015 EKG Time: 1320 Rate: 60 Rhythm: normal sinus rhythm QRS Axis: normal Intervals: normal ST/T Wave abnormalities: normal Conduction Disutrbances: none Narrative Interpretation: unremarkable  ____________________________________________  RADIOLOGY  CT Abdomen Pelvis W Contrast (Final result) Result time: 05/25/15 14:34:37   Final result by Rad Results In Interface (05/25/15 14:34:37)   Narrative:   CLINICAL DATA: abdominal pain x 3 days with rectal pain. Pt reports not experiencing "normal" bowel movements over the last few days has been attempting mira lax. Pt reports experiencing pressure to lower abdomen and rectum upon walking/movement. Pt reports witnessing mucous in stools over the last few days. Pt alert and oriented. Decrease appetite. no hx of CA,  EXAM: CT ABDOMEN AND PELVIS WITH CONTRAST  TECHNIQUE: Multidetector CT imaging of the abdomen and pelvis was performed using the standard protocol following bolus administration of intravenous contrast.  CONTRAST: 100mL OMNIPAQUE IOHEXOL 300 MG/ML SOLN  COMPARISON: None.  FINDINGS: Minimal dependent atelectasis in the lung bases. Moderate  coronary calcifications. Moderate aortoiliac calcifications without aneurysm or stenosis. Surgical clips in the gallbladder fossa. Unremarkable liver, spleen, adrenal glands, kidneys, pancreas. Stomach, small bowel, and colon are nondilated. Normal appendix. Multiple sigmoid diverticula. Urinary bladder incompletely distended. Previous hysterectomy. No ascites. No free air. No adenopathy. No focal inflammatory process localized. No hydronephrosis. Mild spondylitic changes in the lumbar spine.  IMPRESSION: 1. No acute abdominal process. 2. Sigmoid diverticulosis without CT evidence of diverticulitis or abscess. 3. Atherosclerosis, including aortoiliac and coronary artery disease. Please note that although the presence of coronary artery calcium documents the presence of coronary artery disease, the severity of this disease and any potential stenosis cannot be assessed on this non-gated CT examination. Assessment for potential risk factor modification, dietary therapy or pharmacologic therapy may be warranted, if clinically indicated.    ____________________________________________   PROCEDURES  Procedure(s) performed: None  Critical Care performed: No  ____________________________________________   INITIAL IMPRESSION / ASSESSMENT AND PLAN / ED COURSE  Pertinent labs & imaging results that were available during my care of the patient were reviewed by me and considered in my medical decision making (see chart for details).  Patient presents with evident insomnia. Is unclear to me if this could represent psychiatric illness with possible auditory hallucinations or if this may in fact be truly due to lack of sleep because  people are truly playing music and basketball outside of her home each night. She nonetheless has no thoughts of harming herself, she is hemodynamically stable, her neurologic exam does not suggest acute stroke. We'll screen for other etiologies including  obtaining CT head to evaluate for mass lesion, urinalysis, and basic labs. I have asked for. Intake consultation to corroborate history as well as a psychiatric consultation.  ----------------------------------------- 2:57 PM on 05/25/2015 ----------------------------------------- Patient reports feeling improvement after receiving the Ativan. She is calm, well oriented and appropriate. Daughter is with her at bedside in attendance. I discussed the plan of care with the patient, and we will wait for a consultation by psychiatry to further assist with treatment recommendations and possible etiology for her ongoing insomnia.  Plan of care including follow-up on urinalysis and psychiatric consultation signed out to Dr. York Cerise at this time.  ____________________________________________  Please note that my impression and diagnoses are very tentative pending psychiatric consultation  FINAL CLINICAL IMPRESSION(S) / ED DIAGNOSES  Final diagnoses:  Hypokalemia  Insomnia      Sharyn Creamer, MD 05/25/15 1517
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
## 2015-05-27 LAB — URINE CULTURE
Culture: 20000
SPECIAL REQUESTS: NORMAL

## 2015-06-03 ENCOUNTER — Emergency Department
Admission: EM | Admit: 2015-06-03 | Discharge: 2015-06-04 | Disposition: A | Discharge status: Other Acute Inpatient Hospital | Payer: Self-pay | Attending: Emergency Medicine | Admitting: Emergency Medicine

## 2015-06-03 DIAGNOSIS — Z79899 Other long term (current) drug therapy: Secondary | ICD-10-CM | POA: Insufficient documentation

## 2015-06-03 DIAGNOSIS — Z792 Long term (current) use of antibiotics: Secondary | ICD-10-CM | POA: Insufficient documentation

## 2015-06-03 DIAGNOSIS — I639 Cerebral infarction, unspecified: Secondary | ICD-10-CM

## 2015-06-03 DIAGNOSIS — F29 Unspecified psychosis not due to a substance or known physiological condition: Secondary | ICD-10-CM | POA: Insufficient documentation

## 2015-06-03 DIAGNOSIS — F99 Mental disorder, not otherwise specified: Secondary | ICD-10-CM

## 2015-06-03 DIAGNOSIS — F09 Unspecified mental disorder due to known physiological condition: Secondary | ICD-10-CM

## 2015-06-03 DIAGNOSIS — Z7982 Long term (current) use of aspirin: Secondary | ICD-10-CM | POA: Insufficient documentation

## 2015-06-03 LAB — COMPREHENSIVE METABOLIC PANEL
ALBUMIN: 4.6 g/dL (ref 3.5–5.0)
ALT: 20 U/L (ref 14–54)
AST: 21 U/L (ref 15–41)
Alkaline Phosphatase: 51 U/L (ref 38–126)
Anion gap: 6 (ref 5–15)
BILIRUBIN TOTAL: 0.8 mg/dL (ref 0.3–1.2)
BUN: 14 mg/dL (ref 6–20)
CO2: 26 mmol/L (ref 22–32)
CREATININE: 0.77 mg/dL (ref 0.44–1.00)
Calcium: 9.5 mg/dL (ref 8.9–10.3)
Chloride: 106 mmol/L (ref 101–111)
GFR calc Af Amer: >60 mL/min (ref >60)
GFR calc non Af Amer: >60 mL/min (ref >60)
GLUCOSE: 97 mg/dL (ref 65–99)
POTASSIUM: 3.8 mmol/L (ref 3.5–5.1)
Sodium: 138 mmol/L (ref 135–145)
TOTAL PROTEIN: 8 g/dL (ref 6.5–8.1)

## 2015-06-03 LAB — URINALYSIS COMPLETE WITH MICROSCOPIC (ARMC ONLY)
BILIRUBIN URINE: NEGATIVE
Bacteria, UA: NONE SEEN
Glucose, UA: NEGATIVE mg/dL
Ketones, ur: NEGATIVE mg/dL
NITRITE: NEGATIVE
PH: 5 (ref 5.0–8.0)
Protein, ur: NEGATIVE mg/dL
SPECIFIC GRAVITY, URINE: 1.01 (ref 1.005–1.030)

## 2015-06-03 LAB — URINE DRUG SCREEN, QUALITATIVE (ARMC ONLY)
AMPHETAMINES, UR SCREEN: NOT DETECTED
BARBITURATES, UR SCREEN: NOT DETECTED
BENZODIAZEPINE, UR SCRN: NOT DETECTED
CANNABINOID 50 NG, UR ~~LOC~~: NOT DETECTED
Cocaine Metabolite,Ur ~~LOC~~: NOT DETECTED
MDMA (Ecstasy)Ur Screen: NOT DETECTED
Methadone Scn, Ur: NOT DETECTED
OPIATE, UR SCREEN: NOT DETECTED
Phencyclidine (PCP) Ur S: NOT DETECTED
Tricyclic, Ur Screen: NOT DETECTED

## 2015-06-03 LAB — CBC
HEMATOCRIT: 34.9 % — AB (ref 35.0–47.0)
Hemoglobin: 11.5 g/dL — ABNORMAL LOW (ref 12.0–16.0)
MCH: 29 pg (ref 26.0–34.0)
MCHC: 33 g/dL (ref 32.0–36.0)
MCV: 87.8 fL (ref 80.0–100.0)
PLATELETS: 296 10*3/uL (ref 150–440)
RBC: 3.97 MIL/uL (ref 3.80–5.20)
RDW: 14.3 % (ref 11.5–14.5)
WBC: 7.6 10*3/uL (ref 3.6–11.0)

## 2015-06-03 LAB — ACETAMINOPHEN LEVEL

## 2015-06-03 LAB — ETHANOL

## 2015-06-03 LAB — SALICYLATE LEVEL: Salicylate Lvl: <4 mg/dL (ref 2.8–30.0)

## 2015-06-03 MED ORDER — OLANZAPINE 5 MG PO TBDP
10.0000 mg | ORAL_TABLET | ORAL | Status: AC
  Administered 2015-06-03: 10 mg via ORAL
  Filled 2015-06-03: qty 2

## 2015-06-03 MED ORDER — RISPERIDONE 1 MG PO TBDP
1.0000 mg | ORAL_TABLET | Freq: Two times a day (BID) | ORAL | Status: DC
  Administered 2015-06-03 – 2015-06-04 (×2): 1 mg via ORAL
  Filled 2015-06-03 (×3): qty 1

## 2015-06-03 MED ORDER — CEPHALEXIN 500 MG PO CAPS
500.0000 mg | ORAL_CAPSULE | Freq: Three times a day (TID) | ORAL | Status: DC
  Administered 2015-06-04 (×2): 500 mg via ORAL
  Filled 2015-06-03 (×2): qty 1

## 2015-06-03 NOTE — ED Notes (Signed)
BEHAVIORAL HEALTH ROUNDING  Patient sleeping: No Patient alert and oriented: yes Behavior appropriate: Yes.  ; If no, describe:  Nutrition and fluids offered: Yes  Toileting and hygiene offered: Yes  Sitter present: yes Law enforcement present: Yes [employer] 

## 2015-06-03 NOTE — ED Notes (Signed)
EDMD at bedside

## 2015-06-03 NOTE — ED Notes (Signed)
BEHAVIORAL HEALTH ROUNDING Patient sleeping: Yes.   Patient alert and oriented: not applicable Behavior appropriate: Yes.    Nutrition and fluids offered: No Toileting and hygiene offered: No Sitter present: q15 minute observations Law enforcement present: Yes [employer]  ENVIRONMENTAL ASSESSMENT Potentially harmful objects out of patient reach: Yes.   Personal belongings secured: Yes.   Patient dressed in hospital provided attire only: Yes.   Plastic bags out of patient reach: Yes.   Patient care equipment (cords, cables, call bells, lines, and drains) shortened, removed, or accounted for: Yes.   Equipment and supplies removed from bottom of stretcher: Yes.   Potentially toxic materials out of patient reach: Yes.   Sharps container removed or out of patient reach: Yes.   

## 2015-06-03 NOTE — ED Notes (Addendum)
BEHAVIORAL HEALTH ROUNDING Patient sleeping: No. Patient alert and oriented: no Behavior appropriate: No.; If no, describe: pt yelling in hallway, slurred speech Nutrition and fluids offered: Yes  Toileting and hygiene offered: Yes  Sitter present: yes Law enforcement present: Yes    ENVIRONMENTAL ASSESSMENT Potentially harmful objects out of patient reach: Yes.   Personal belongings secured: Yes.   Patient dressed in hospital provided attire only: Yes.   Plastic bags out of patient reach: Yes.   Patient care equipment (cords, cables, call bells, lines, and drains) shortened, removed, or accounted for: Yes.   Equipment and supplies removed from bottom of stretcher: Yes.   Potentially toxic materials out of patient reach: Yes.   Sharps container removed or out of patient reach: Yes.

## 2015-06-03 NOTE — ED Notes (Signed)
BEHAVIORAL HEALTH ROUNDING  Patient sleeping: No Patient alert and oriented: yes Behavior appropriate: Yes.  ; If no, describe:  Nutrition and fluids offered: Yes - pt asked for water then refused when I brought to the room  Toileting and hygiene offered: Yes  Sitter present: yes Law enforcement present: Yes [employer]
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
## 2015-06-03 NOTE — ED Notes (Signed)
BEHAVIORAL HEALTH ROUNDING  Patient sleeping: No Patient alert and oriented: yes Behavior appropriate: Yes.  ; If no, describe:  Nutrition and fluids offered: Yes  Toileting and hygiene offered: Yes  Sitter present: yes Law enforcement present: Yes [employer]

## 2015-06-03 NOTE — ED Notes (Signed)
BEHAVIORAL HEALTH ROUNDING  Patient sleeping: yes Patient alert and oriented: yes Behavior appropriate: Yes.  ; If no, describe:  Nutrition and fluids offered: Yes  Toileting and hygiene offered: Yes  Sitter present: yes Law enforcement present: Yes [employer]
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
## 2015-06-03 NOTE — ED Notes (Signed)
BEHAVIORAL HEALTH ROUNDING Patient sleeping: Yes.   Patient alert and oriented: not applicable Behavior appropriate: Yes.    Nutrition and fluids offered: No Toileting and hygiene offered: No Sitter present: q15 minute observations Law enforcement present: Yes [employer] 
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
## 2015-06-03 NOTE — ED Notes (Signed)
Per tech, she offered pt dinner tray and the pt refuses to eat at this time

## 2015-06-03 NOTE — ED Notes (Signed)
ENVIRONMENTAL ASSESSMENT Potentially harmful objects out of patient reach: Yes.   Personal belongings secured: Yes.   Patient dressed in hospital provided attire only: Yes.   Plastic bags out of patient reach: Yes.   Patient care equipment (cords, cables, call bells, lines, and drains) shortened, removed, or accounted for: Yes.   (pt in hallway) Equipment and supplies removed from bottom of stretcher: Yes.   Potentially toxic materials out of patient reach: Yes.   Sharps container removed or out of patient reach: Yes.

## 2015-06-04 ENCOUNTER — Inpatient Hospital Stay
Admission: EM | Admit: 2015-06-04 | Discharge: 2015-06-07 | DRG: 885 | Disposition: A | Discharge status: Other Acute Inpatient Hospital | Payer: No Typology Code available for payment source | Attending: Psychiatry | Admitting: Psychiatry

## 2015-06-04 DIAGNOSIS — F329 Major depressive disorder, single episode, unspecified: Secondary | ICD-10-CM | POA: Diagnosis present

## 2015-06-04 DIAGNOSIS — A523 Neurosyphilis, unspecified: Secondary | ICD-10-CM | POA: Diagnosis present

## 2015-06-04 DIAGNOSIS — A530 Latent syphilis, unspecified as early or late: Secondary | ICD-10-CM | POA: Diagnosis present

## 2015-06-04 DIAGNOSIS — Z809 Family history of malignant neoplasm, unspecified: Secondary | ICD-10-CM

## 2015-06-04 DIAGNOSIS — Z79899 Other long term (current) drug therapy: Secondary | ICD-10-CM | POA: Diagnosis not present

## 2015-06-04 DIAGNOSIS — Z87440 Personal history of urinary (tract) infections: Secondary | ICD-10-CM | POA: Diagnosis not present

## 2015-06-04 DIAGNOSIS — Z8673 Personal history of transient ischemic attack (TIA), and cerebral infarction without residual deficits: Secondary | ICD-10-CM

## 2015-06-04 DIAGNOSIS — Z9851 Tubal ligation status: Secondary | ICD-10-CM

## 2015-06-04 DIAGNOSIS — F09 Unspecified mental disorder due to known physiological condition: Secondary | ICD-10-CM

## 2015-06-04 DIAGNOSIS — F99 Mental disorder, not otherwise specified: Secondary | ICD-10-CM

## 2015-06-04 DIAGNOSIS — G4700 Insomnia, unspecified: Secondary | ICD-10-CM | POA: Diagnosis present

## 2015-06-04 DIAGNOSIS — F29 Unspecified psychosis not due to a substance or known physiological condition: Principal | ICD-10-CM | POA: Diagnosis present

## 2015-06-04 DIAGNOSIS — F209 Schizophrenia, unspecified: Secondary | ICD-10-CM | POA: Diagnosis present

## 2015-06-04 DIAGNOSIS — R451 Restlessness and agitation: Secondary | ICD-10-CM | POA: Diagnosis present

## 2015-06-04 DIAGNOSIS — Z7982 Long term (current) use of aspirin: Secondary | ICD-10-CM | POA: Diagnosis not present

## 2015-06-04 DIAGNOSIS — N390 Urinary tract infection, site not specified: Secondary | ICD-10-CM | POA: Diagnosis present

## 2015-06-04 MED ORDER — RISPERIDONE 1 MG PO TBDP
1.0000 mg | ORAL_TABLET | Freq: Two times a day (BID) | ORAL | Status: DC
  Administered 2015-06-04 – 2015-06-05 (×2): 1 mg via ORAL
  Filled 2015-06-04 (×2): qty 1

## 2015-06-04 MED ORDER — FOSFOMYCIN TROMETHAMINE 3 G PO PACK
3.0000 g | PACK | Freq: Once | ORAL | Status: AC
  Administered 2015-06-04: 3 g via ORAL
  Filled 2015-06-04 (×2): qty 3

## 2015-06-04 MED ORDER — MAGNESIUM HYDROXIDE 400 MG/5ML PO SUSP: 30.0000 mL | Freq: Every day | ORAL | Status: DC | PRN

## 2015-06-04 MED ORDER — ALUM & MAG HYDROXIDE-SIMETH 200-200-20 MG/5ML PO SUSP: 30.0000 mL | ORAL | Status: DC | PRN

## 2015-06-04 MED ORDER — CEPHALEXIN 500 MG PO CAPS: 500.0000 mg | ORAL_CAPSULE | Freq: Three times a day (TID) | ORAL | Status: DC

## 2015-06-04 MED ORDER — ACETAMINOPHEN 325 MG PO TABS
650.0000 mg | ORAL_TABLET | Freq: Four times a day (QID) | ORAL | Status: DC | PRN
  Filled 2015-06-04: qty 2

## 2015-06-04 NOTE — ED Notes (Signed)
BEHAVIORAL HEALTH ROUNDING Patient sleeping: YES Patient alert and oriented: SLEEPING Behavior appropriate: SLEEPING Nutrition and fluids offered: SLEEPING Toileting and hygiene offered: SLEEPING Sitter present: YES Law enforcement present: YES 

## 2015-06-04 NOTE — Progress Notes (Signed)
   06/04/15 1520  Clinical Encounter Type  Visited With Patient  Visit Type Initial  Referral From Nurse  Consult/Referral To Chaplain  Spiritual Encounters  Spiritual Needs Prayer;Emotional  Stress Factors  Patient Stress Factors Exhausted;Family relationships;Health changes;Loss of control;Major life changes  Advance Directives (For Healthcare)  Does patient have an advance directive? No  Would patient like information on creating an advanced directive? Yes - Scientist, clinical (histocompatibility and immunogenetics) given (received in ED)  Met w/patient and provided pastoral care & prayer.  Chap. Baldemar Dady G. [place]
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
## 2015-06-04 NOTE — ED Notes (Signed)
Pt assisted to BR to void, pt with unsteady gait noted; stand-by assist required, noting pt stumbling at times; returned to bed; no c/o voiced

## 2015-06-04 NOTE — ED Notes (Signed)
Pt lying on stretcher in darkened exam room with no distress noted; [facility]; will evaluate pt's gait prior to transferring; MD aware

## 2015-06-04 NOTE — ED Notes (Signed)

## 2015-06-04 NOTE — ED Notes (Signed)
BEHAVIORAL HEALTH ROUNDING Patient sleeping: yes Patient alert and oriented: not applicable Behavior appropriate: Yes.  ; If no, describe:  Nutrition and fluids offered: Yes  Toileting and hygiene offered: Yes  Sitter present: no Law enforcement present: Yes
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
## 2015-06-04 NOTE — Progress Notes (Signed)
Admitted from ED, a 64 year old female with a diagnosis of new onset psychosis. Family has reported that patient is exhibiting increasing episodes of paranoia and confusion.  See admission assessment for complete clinical data. A search was completed and there was no contraband found. Verifying nurse was Toniann FailWendy, Charity fundraiserN.

## 2015-06-04 NOTE — ED Provider Notes (Addendum)
-----------------------------------------   7:24 AM on 06/04/2015 -----------------------------------------   Blood pressure 119/68, pulse 55, temperature 98.9 F (37.2 C), temperature source Oral, resp. rate 17, height 5\' 6"  (1.676 m), weight 120 lb (54.432 kg), SpO2 98 %.  The patient had no acute events since last update.  Calm and cooperative.  Patient does have a UTI, and is currently being treated with cephalexin.   Disposition is pending per Psychiatry/Behavioral Medicine team recommendations.     Sharyn CreamerMark Quale, MD 06/04/15 0724  ----------------------------------------- 11:39 AM on 06/04/2015 -----------------------------------------  Admitting to psychiatry service. Schizophrenia, acute exacerbation Urinary tract infection  Sharyn CreamerMark Quale, MD 06/04/15 1140

## 2015-06-04 NOTE — Tx Team (Signed)
Initial Interdisciplinary Treatment Plan   PATIENT STRESSORS: Health problems Marital or family conflict   PATIENT STRENGTHS: Average or above average intelligence Communication skills   PROBLEM LIST: Problem List/Patient Goals Date to be addressed Date deferred Reason deferred Estimated date of resolution  paranoia      confusion                                                 DISCHARGE CRITERIA:  Improved stabilization in mood, thinking, and/or behavior Safe-care adequate arrangements made  PRELIMINARY DISCHARGE PLAN: Outpatient therapy Return to previous living arrangement  PATIENT/FAMIILY INVOLVEMENT: This treatment plan has been presented to and reviewed with the patient, Autumn Pruitt, and/or family member. The patient and family have been given the opportunity to ask questions and make suggestions.  Autumn Pruitt 06/04/2015, 3:50 PM

## 2015-06-04 NOTE — ED Notes (Signed)
pt resting quietly on stretcher in darkened exam room; eyes closed, resp even/unlab, no distress noted; will continue to monitor 

## 2015-06-04 NOTE — ED Notes (Signed)
BEHAVIORAL HEALTH ROUNDING Patient sleeping: No. Patient alert and oriented: yes Behavior appropriate: Yes.  ; If no, describe:  Nutrition and fluids offered: Yes  Toileting and hygiene offered: Yes  Sitter present: no Law enforcement present: Yes  

## 2015-06-05 ENCOUNTER — Encounter: Payer: Self-pay | Admitting: Psychiatry

## 2015-06-05 DIAGNOSIS — N390 Urinary tract infection, site not specified: Secondary | ICD-10-CM | POA: Diagnosis present

## 2015-06-05 DIAGNOSIS — F99 Mental disorder, not otherwise specified: Secondary | ICD-10-CM

## 2015-06-05 LAB — VITAMIN B12: Vitamin B-12: 331 pg/mL (ref 180–914)

## 2015-06-05 LAB — FOLATE: Folate: 16.2 ng/mL (ref >5.9)

## 2015-06-05 MED ORDER — HALOPERIDOL 5 MG PO TABS
5.0000 mg | ORAL_TABLET | Freq: Once | ORAL | Status: AC
  Administered 2015-06-05: 5 mg via ORAL
  Filled 2015-06-05: qty 1

## 2015-06-05 MED ORDER — RISPERIDONE 1 MG PO TBDP
2.0000 mg | ORAL_TABLET | Freq: Two times a day (BID) | ORAL | Status: DC
  Administered 2015-06-05 – 2015-06-07 (×4): 2 mg via ORAL
  Filled 2015-06-05 (×4): qty 2

## 2015-06-05 MED ORDER — ASPIRIN 81 MG PO CHEW
81.0000 mg | CHEWABLE_TABLET | Freq: Every day | ORAL | Status: DC
  Administered 2015-06-05 – 2015-06-07 (×3): 81 mg via ORAL
  Filled 2015-06-05 (×3): qty 1

## 2015-06-05 MED ORDER — BENZTROPINE MESYLATE 1 MG PO TABS
1.0000 mg | ORAL_TABLET | Freq: Once | ORAL | Status: AC
  Administered 2015-06-05: 1 mg via ORAL
  Filled 2015-06-05: qty 1

## 2015-06-05 MED ORDER — LORAZEPAM 1 MG PO TABS
1.0000 mg | ORAL_TABLET | Freq: Once | ORAL | Status: AC
  Administered 2015-06-05: 1 mg via ORAL
  Filled 2015-06-05: qty 1

## 2015-06-05 MED ORDER — TRAZODONE HCL 100 MG PO TABS
100.0000 mg | ORAL_TABLET | Freq: Every day | ORAL | Status: DC
Start: 2015-06-05 — End: 2015-06-07
  Administered 2015-06-05 – 2015-06-07 (×2): 100 mg via ORAL
  Filled 2015-06-05 (×2): qty 1

## 2015-06-05 NOTE — Progress Notes (Signed)
Recreation Therapy Notes  At approximately 11:10 am, LRT attempted assessment. Patient sleeping. LRT will continue to attempt assessment tomorrow.  Jacquelynn CreeGreene,Gillie Fleites M, LRT/CTRS 06/05/2015 1:37 PM

## 2015-06-05 NOTE — Progress Notes (Signed)
Patient continues to be loud, came to the nurses' station, banging on the door, looking for her husband. Changes in patient's condition discussed with Dr. Mayford KnifeWilliams, TO/RBO of Ativan 1 mg, Haldol 5 mg and Cogentin 1 mg x 1 orders received.

## 2015-06-05 NOTE — Progress Notes (Signed)
Continues to be sexually inappropriate, medications given for delusional behaviors as ordered, will monitor response to medications.

## 2015-06-05 NOTE — Progress Notes (Signed)
LCSW attempted to do patients assessment. Patient reported she has washed her hair 3 times and banged on the door and the devils is still in her hair. Patient is unable to participate due to her delusions. Will try again tommorow

## 2015-06-05 NOTE — Plan of Care (Signed)
Problem: Ineffective individual coping Goal: STG: Patient will remain free from self harm Outcome: Progressing Patient has remained free of harm, observed in and out of her room; gait unsteady, speech slurred, behavior elated, "I am happy ..." Has remained free of harm, 15 minute checks for safety continues. Urine Specimen pending, hat placed on the toilet bow; explanation of new antibiotics (Fosfomycin) provided. Will continue to monitor.

## 2015-06-05 NOTE — Progress Notes (Signed)
Patient alert and oriented to name and place. Patient confused and delusional and needs support and review of current plan of care and meds for psychosis. Denies SI/HI/AVH att his time. Patient with good adls. Unsteady gait noted and writer places on full fall protocol and assists to dining room for am meal. Dining room staff aware patient will need supervision and assist back to bed. Reported to Clinical research associatewriter patient slept poorly last night. Patient encouraged to rest this morning. Safety maintained.

## 2015-06-05 NOTE — Progress Notes (Signed)
Urine sample obtained with results pending.  

## 2015-06-05 NOTE — [facility] Suicide Risk Assessment (Signed)
[facility] Admission Suicide Risk Assessment   Nursing information obtained from:  Patient Demographic factors:  Living alone Current Mental Status:  NA Loss Factors:  Decline in physical health Historical Factors:  NA Risk Reduction Factors:  Positive social support Total Time spent with patient: 1 hour Principal Problem: Psychosis, organic Diagnosis:   Patient Active Problem List   Diagnosis Date Noted  . UTI (urinary tract infection) [N39.0] 06/05/2015  . 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 Stroke [I63.9] 06/03/2015     Continued Clinical Symptoms:  Alcohol Use Disorder Identification Test Final Score (AUDIT): 0 The "Alcohol Use Disorders Identification Test", Guidelines for Use in Primary Care, Second Edition.  World Science writer [facility]). Score between 0-7:  no or low risk or alcohol related problems. Score between 8-15:  moderate risk of alcohol related problems. Score between 16-19:  high risk of alcohol related problems. Score 20 or above:  warrants further diagnostic evaluation for alcohol dependence and treatment.   CLINICAL FACTORS:   Currently Psychotic   Musculoskeletal: Strength & Muscle Tone: within normal limits Gait & Station: normal Patient leans: N/A  Psychiatric Specialty Exam: Physical Exam  Nursing note and vitals reviewed.   Review of Systems  All other systems reviewed and are negative.   Blood pressure 110/71, pulse 100, temperature 98.3 F (36.8 C), temperature source Oral, resp. rate 18, height  (1.676 m), weight 51.71 kg (114 lb), SpO2 98 %.Body mass index is 18.41 kg/(m^2).  General Appearance: Fairly Groomed  Patent attorney::  Fair  Speech:  Slow  Volume:  Decreased  Mood:  Anxious  Affect:  Blunt  Thought Process:  Disorganized  Orientation:  Other:  Person and place only  Thought Content:  Delusions and Paranoid Ideation  Suicidal Thoughts:  No  Homicidal Thoughts:  No  Memory:  Immediate;   Poor Recent;   Poor Remote;    Poor  Judgement:  Impaired  Insight:  Lacking  Psychomotor Activity:  Decreased  Concentration:  Poor  Recall:  Poor  Fund of Knowledge:Fair  Language: Fair  Akathisia:  No  Handed:  Right  AIMS (if indicated):     Assets:  Social Support  Sleep:     Cognition: WNL  ADL's:  Intact     COGNITIVE FEATURES THAT CONTRIBUTE TO RISK:  None    SUICIDE RISK:   Moderate:  Frequent suicidal ideation with limited intensity, and duration, some specificity in terms of plans, no associated intent, good self-control, limited dysphoria/symptomatology, some risk factors present, and identifiable protective factors, including available and accessible social support.  PLAN OF CARE: Hospital admission, medication management, discharge planning.  Medical Decision Making:  New problem, with additional work up planned, Review of Psycho-Social Stressors (1), Review or order clinical lab tests (1), Review of Medication Regimen & Side Effects (2) and Review of New Medication or Change in Dosage (2)   Ms. Grego is a 64 year old female with history of cognitive decline and psychosis she developed following her stroke. She was admitted for worsening psychosis, disorganized, erratic behavior and agitation possibly in the context of recent UTI.  1. Agitation. This has resolved but this morning the patient was given a combination of Haldol and Ativan.  2. Psychosis. The patient was maintained on low-dose Risperdal at home. Risperdal was increased to 2 mg twice daily to address psychotic symptoms.   3. Insomnia. She is on trazodone.  4. Stroke prevention. We continue aspirin.  5. UTI. The patient was seen in the emergency room  10 days ago. She was prescribed Keflex. We note that she pick it up from the pharmacy. She returned to the emergency room with symptoms of UTI again. Keflex was discontinued and she was given a single dose of fosfomycin. Urine culture pending   6. Disposition. She will likely be  discharged to home with her husband. She will follow-up with [facility].    I certify that inpatient services furnished can reasonably be expected to improve the patient's condition.   Jolanta Pucilowska 06/05/2015, 4:42 PM

## 2015-06-05 NOTE — Plan of Care (Signed)
Problem: Ineffective individual coping Goal: LTG: Patient will report a decrease in negative feelings Outcome: Progressing NO SI/HI/AVH at this time. Encouraged to learn coping skills and initiate for management of diagnosis.

## 2015-06-05 NOTE — Progress Notes (Signed)
Delusional and experiencing "Positive Transference", patient referred to the writer as her husband, continues to be loud and waking others up; patient was moved from 314 A to room 309 A which is closer to the nurses' station.

## 2015-06-05 NOTE — Progress Notes (Signed)
Loud, confused, paranoid, patient came to the nurses' station at this time with just the top scrub and a towel wrap around her waist, "there is a man in my room ..." Fearful, reassured by the Charge Nurse, who redirected, assisted, stayed with patient until her fear subsided.

## 2015-06-05 NOTE — Progress Notes (Signed)
Recreation Therapy Notes  Date: 07.20.16 Time: 3:00 pm Location: Craft Room  Group Topic: Self-expression  Goal Area(s) Addresses: Patient will be able to identify a color that represents each emotion. Patient will verbalize benefit of using art as a means of self-expression. Patient will verbalize one positive emotion experienced while participating in activity.   Behavioral Response: Did not attend  Intervention: The Colors Within Me  Activity: Patients were given a blank face worksheet and instructed to analyze what emotions they are experiencing, pick a color for each emotion, and color how much of that emotion they are experiencing.  Education: LRT educated patients on different forms of self-expression.   Education Outcome: Patient did not attend group.  Clinical Observations/Feedback: Patient did not attend group.  Cheralyn Oliver M, LRT/CTRS 06/05/2015 3:57 PM 

## 2015-06-05 NOTE — Progress Notes (Signed)
Pleasantly confused, mood and affect upbeat and happy, denied pain, re-oriented to place and time. Denied any form of hallucinations at this time.

## 2015-06-05 NOTE — H&P (Signed)
Psychiatric Admission Assessment Adult  Patient Identification: Autumn Pruitt MRN:  [id number] Date of Evaluation:  06/05/2015 Chief Complaint:  Schizophrenia Principal Diagnosis: Psychosis, organic Diagnosis:   Patient Active Problem List   Diagnosis Date Noted  . UTI (urinary tract infection) [N39.0] 06/05/2015  . 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 Stroke [I63.9] 06/03/2015   History of Present Illness:  Identifying data. [name] is a 64 year old female with history of psychosis.  Chief complaint. The patient unable to state.  History of present illness. Autumn Pruitt suffered a stroke. Following her stroke she developed mild cognitive decline and some psychotic symptoms that were addressed with low-dose Risperdal. 10 days ago as she was seen in the emergency room and diagnosed with UTI. She was prescribed Keflex for 10 days. We note that she wanted at the pharmacy we are not certain if she truly took it. She returned to the emergency room reportedly psychotic. Per family report the patient was agitated screaming and yelling at her neighbors and was brought to the hospital. The patient herself is very disorganized and unable to answer any questions. She is paranoid and delusional she believes that [name] is in her hair and has been washing her hair continuously. She believes that her fianc left her yesterday. She is oriented to person and place only. She is unable to tell me about her depression, anxiety, or substance use. Her drug screen was negative for substances We are not certain if she were compliant with medications.  Past psychiatric history. None until this stroke. Apparently her symptoms were well controlled with low-dose Risperdal.  Family psychiatric history. Nonreported.  Social history. Apparently she lives independently her daughter checks on her daily.  ITotal Time spent with patient: 1 hour  Past Medical History:  Past Medical History  Diagnosis Date  . CVA  (cerebral infarction)     Past Surgical History  Procedure Laterality Date  . Tubal ligation     Family History:  Family History  Problem Relation Age of Onset  . Family history unknown: Yes   Social History:  History  Alcohol Use No     History  Drug Use No    History   Social History  . Marital Status: Widowed    Spouse Name: N/A  . Number of Children: N/A  . Years of Education: N/A   Social History Main Topics  . Smoking status: Never Smoker   . Smokeless tobacco: Never Used  . Alcohol Use: No  . Drug Use: No  . Sexual Activity: Not Currently   Other Topics Concern  . None   Social History Narrative   Additional Social History:    Pain Medications: N/A                     Musculoskeletal: Strength & Muscle Tone: within normal limits Gait & Station: normal Patient leans: N/A  Psychiatric Specialty Exam: Physical Exam  Nursing note and vitals reviewed.   Review of Systems  All other systems reviewed and are negative.   Blood pressure 110/71, pulse 100, temperature 98.3 F (36.8 C), temperature source Oral, resp. rate 18, height 5\' 6"  (1.676 m), weight 51.71 kg (114 lb), SpO2 98 %.Body mass index is 18.41 kg/(m^2).  See SRA.  Sleep:      Risk to Self: Is patient at risk for suicide?: No Risk to Others:   Prior Inpatient Therapy:   Prior Outpatient Therapy:    Alcohol Screening: 1. How often do you have a drink containing alcohol?: Never 9. Have you or someone else been injured as a result of your drinking?: No 10. Has a relative or friend or a doctor or another health worker been concerned about your drinking or suggested you cut down?: No Alcohol Use Disorder Identification Test Final Score (AUDIT): 0 Brief Intervention: AUDIT score less than 7 or less-screening does not suggest unhealthy drinking-brief intervention not indicated  Allergies:  No Known Allergies Lab  Results:  Results for orders placed or performed during the hospital encounter of 06/04/15 (from the past 48 hour(s))  Folate     Status: None   Collection Time: 06/05/15  2:39 PM  Result Value Ref Range   Folate 16.2 >5.9 ng/mL   Current Medications: Current Facility-Administered Medications  Medication Dose Route Frequency Provider Last Rate Last Dose  . acetaminophen (TYLENOL) tablet 650 mg  650 mg Oral Q6H PRN Audery AmelJohn T Clapacs, MD      . alum & mag hydroxide-simeth (MAALOX/MYLANTA) 200-200-20 MG/5ML suspension 30 mL  30 mL Oral Q4H PRN Audery AmelJohn T Clapacs, MD      . aspirin chewable tablet 81 mg  81 mg Oral Daily Zniyah Midkiff B Sharan Mcenaney, MD   81 mg at 06/05/15 1347  . magnesium hydroxide (MILK OF MAGNESIA) suspension 30 mL  30 mL Oral Daily PRN Audery AmelJohn T Clapacs, MD      . risperiDONE (RISPERDAL M-TABS) disintegrating tablet 2 mg  2 mg Oral BID Steffani Dionisio B Calen Posch, MD      . traZODone (DESYREL) tablet 100 mg  100 mg Oral QHS Christina Waldrop B Avelynn Sellin, MD       PTA Medications: Prescriptions prior to admission  Medication Sig Dispense Refill Last Dose  . aspirin EC 81 MG tablet Take 1 tablet (81 mg total) by mouth daily. 30 tablet 1 06/03/2015 at Unknown time  . cephALEXin (KEFLEX) 500 MG capsule Take 1 capsule (500 mg total) by mouth 4 (four) times daily. 40 capsule 0 06/03/2015 at Unknown time  . potassium chloride (K-DUR) 10 MEQ tablet Take 1 tablet (10 mEq total) by mouth 2 (two) times daily. 14 tablet 0 06/03/2015 at Unknown time  . traZODone (DESYREL) 50 MG tablet Take 1 tablet (50 mg total) by mouth at bedtime. 30 tablet 0 06/03/2015 at Unknown time    Previous Psychotropic Medications: Yes   Substance Abuse History in the last 12 months:  No.    Consequences of Substance Abuse: Negative  Results for orders placed or performed during the hospital encounter of 06/04/15 (from the past 72 hour(s))  Folate     Status: None   Collection Time: 06/05/15  2:39 PM  Result Value Ref Range   Folate  16.2 >5.9 ng/mL    Observation Level/Precautions:  15 minute checks  Laboratory:  CBC Chemistry Profile UDS UA  Psychotherapy:    Medications:    Consultations:    Discharge Concerns:    Estimated LOS:  Other:     Psychological Evaluations: No   Treatment Plan Summary: Daily contact with patient to assess and evaluate symptoms and progress in treatment and Medication management  Medical Decision Making:  New problem, with additional work up planned, Review of Psycho-Social Stressors (1), Review of Medication Regimen & Side Effects (2) and Review of  New Medication or Change in Dosage (2)   Ms. Morr is a 64 year old female with history of cognitive decline and psychosis she developed following her stroke. She was admitted for worsening psychosis, disorganized, erratic behavior and agitation possibly in the context of recent UTI.  1. Agitation. This has resolved but this morning the patient was given a combination of Haldol and Ativan.  2. Psychosis. The patient was maintained on low-dose Risperdal at home. Risperdal was increased to 2 mg twice daily to address psychotic symptoms.   3. Insomnia. She is on trazodone.  4. Stroke prevention. We continue aspirin.  5. UTI. The patient was seen in the emergency room 10 days ago. She was prescribed Keflex. We note that she pick it up from the pharmacy. She returned to the emergency room with symptoms of UTI again. Keflex was discontinued and she was given a single dose of fosfomycin. Urine culture pending   6. Disposition. She will likely be discharged to home with her husband. She will follow-up with [facility].    I certify that inpatient services furnished can reasonably be expected to improve the patient's condition.   Cyprian Gongaware 7/20/20164:49 PM

## 2015-06-05 NOTE — [facility] Group Notes (Signed)
[facility] Group Notes:  (Nursing/MHT/Case Management/Adjunct)  Date:  06/05/2015  Time:  1:31 PM  Type of Therapy:  Psychoeducational Skills  Participation Level:  Did Not Attend   Jordie Schreur Travis Laelle Bridgett 06/05/2015, 1:31 PM 

## 2015-06-06 LAB — RPR, QUANT+TP ABS (REFLEX)
Rapid Plasma Reagin, Quant: 1:256 {titer} — ABNORMAL HIGH
T Pallidum Abs: POSITIVE — AB

## 2015-06-06 LAB — RPR: RPR: REACTIVE — AB

## 2015-06-06 MED ORDER — BENZTROPINE MESYLATE 1 MG PO TABS
0.5000 mg | ORAL_TABLET | Freq: Two times a day (BID) | ORAL | Status: DC
  Administered 2015-06-07: 0.5 mg via ORAL
  Administered 2015-06-07: 1 mg via ORAL
  Filled 2015-06-06 (×2): qty 1

## 2015-06-06 NOTE — Progress Notes (Signed)
Patient not able to partake in her assessment July 20th approached patient in am and pm LCSW met with staff to inquire if patient was able to participate in an assessment today. Staff indicated she was not. LCSW will follow up later this afternoon.

## 2015-06-06 NOTE — [facility] Group Notes (Signed)
[facility] LCSW Group Therapy  06/06/2015 4:56 PM  Type of Therapy:  Group Therapy  Participation Level:  Did Not Attend  Participation Quality:    Affect:    Cognitive:    Insight:    Engagement in Therapy:    Modes of Intervention:    Summary of Progress/Problems:  Cheron Schaumann 06/06/2015, 4:56 PM

## 2015-06-06 NOTE — Tx Team (Signed)
Interdisciplinary Treatment Plan Update (Adult)  Date:  06/06/2015 Time Reviewed:  4:57 PM  Progress in Treatment: Attending groups: no Participating in groups:  No. Taking medication as prescribed:  No. Tolerating medication:  Yes. Family/Significant othe contact made:  Yes, individual(s) contacted:   Daughter called Patient understands diagnosis:  No. Discussing patient identified problems/goals with staff:  No. Medical problems stabilized or resolved:  Yes. Denies suicidal/homicidal ideation: Yes. Issues/concerns per patient self-inventory:  Yes. Other:  New problem(s) identified: No, Describe:     Discharge Plan or Barriers: [facility] or [facility]  Reason for Continuation of Hospitalization: Delusions   Comments:  Estimated length of stay: 5 days  New goal(s): to have family assess pt baseline  Review of initial/current patient goals per problem list:   Refer to plan of care  Attendees: Patient:  Autumn Pruitt 7/21/20164:57 PM  Physician:  Dr Jennet Maduro  Nursing:   Jerry Caras RN  Case Manager:  Arrie Senate LCSW  Counselor:  Charleston Ropes LCSWA  Other:  Princella Ion LRT   7/21/20164:57 PM   7/21/20164:57 PM   7/21/20164:57 PM   7/21/20164:57 PM   7/21/20164:57 PM   7/21/20164:57 PM  Other:   7/21/20164:57 PM  Other:   7/21/20164:57 PM  Other:  7/21/20164:57 PM  Other:  7/21/20164:57 PM  Other:  7/21/20164:57 PM  Other:  7/21/20164:57 PM  Other:  7/21/20164:57 PM  Other:  7/21/20164:57 PM  Other:   7/21/20164:57 PM   Scribe for Treatment Team:   Cheron Schaumann, 06/06/2015, 4:57 PM

## 2015-06-06 NOTE — Progress Notes (Signed)
Recreation Therapy Notes  Date: 07.21.16 Time: 3:00 pm Location: Craft Room  Group Topic: Coping Skills and Leisure Education  Goal Area(s) Addresses:  Patient will identify things they are grateful for. Patient will identify how being grateful can influence decision making.  Behavioral Response: Attentive, Disruptive  Intervention: Grateful Wheel  Activity: Patients were given an "I Am Grateful For" worksheet and instructed to listed at least one thing they are grateful for under each category.  Education:LRT educated patients on how being aware of what they are grateful for affects their decision making skills.  Education Outcome: Acknowledges education/In group clarification offered  Clinical Observations/Feedback: Patient completed worksheet with assistance from LRT. LRT had to redirect patient multiple times from having side conversations during group discussion. Patient compliant.   Jacquelynn Cree, LRT/CTRS 06/06/2015 4:29 PM

## 2015-06-06 NOTE — Progress Notes (Signed)
LCSW called Daughter Bailey Mech [phone] with patients verbal consent. Daughter will visit at 6pm and then leave LCSW Barbara Cower a voice mail message indicating a baseline of wellness.

## 2015-06-06 NOTE — [facility] Group Notes (Signed)
[facility] Group Notes:  (Nursing/MHT/Case Management/Adjunct)  Date:  06/06/2015  Time:  4:37 PM  Type of Therapy:  Psychoeducational Skills  Participation Level:  Did Not Attend   Autumn Pruitt 06/06/2015, 4:37 PM

## 2015-06-06 NOTE — [facility] Group Notes (Signed)
[facility] Group Notes:  (Nursing/MHT/Case Management/Adjunct)  Date:  06/06/2015  Time:  2:20 AM  Type of Therapy:  Group Therapy  Participation Level:  Did Not Attend  Participation Quality:  Appropriate and Attentive  Affect:  Appropriate  Cognitive:  Alert and Appropriate  Insight:  Appropriate  Engagement in Group:  Engaged  Modes of Intervention:  Discussion  Summary of Progress/Problems:  Autumn Pruitt Autumn Pruitt 06/06/2015, 2:20 AM
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
## 2015-06-06 NOTE — [facility] Group Notes (Signed)
[facility] LCSW Aftercare Discharge Planning Group Note  06/06/2015 9:27 AM  Participation Quality:  DID NOT ATTEND  Affect:    Cognitive:    Insight:    Engagement in Group:    Modes of Intervention:    Summary of Progress/Problems:  Cheron Schaumann 06/06/2015, 9:27 AM

## 2015-06-06 NOTE — [facility] Group Notes (Deleted)
[facility] LCSW Group Therapy  06/06/2015 5:03 PM  Type of Therapy:  Group Therapy  Participation Level:  Did not attend  Participation Quality:    Affect:    Cognitive:    Insight:    Engagement in Therapy:    Modes of Intervention:    Summary of Progress/Problems:  Autumn Pruitt 06/06/2015, 5:03 PM

## 2015-06-06 NOTE — Progress Notes (Signed)
Pt isolates in room. Remains disorganized and confused. Tolerates PO bedtime medications well. Denies SI/HI. No AV/H noted. No c/o pain/discomfort noted.

## 2015-06-06 NOTE — Progress Notes (Signed)
Recreation Therapy Notes  INPATIENT RECREATION THERAPY ASSESSMENT  Patient Details Name: Autumn Pruitt MRN: [id number] DOB: [birth year] Today's Date: 06/06/2015  Patient Stressors:  Patient reported no stressors.  Coping Skills:   Exercise, Art/Dance, Talking, Music, Sports, Other (Comment) (Soap operas, TV)  Personal Challenges: Time Management, Trusting Others  Leisure Interests (2+):  Individual - TV, Individual - Other (Comment) (Talk to someone)  Awareness of Community Resources:  Yes  Community Resources:  [employer]  Current Use: Yes  If no, Barriers?:    Patient Strengths:  "I'm Lili." long hair, loves herself  Patient Identified Areas of Improvement:  Getting used to being back home once she goes home  Current Recreation Participation:  Crossword puzzles  Patient Goal for Hospitalization:  To be happy and have a husband  [place] of Residence:  [place] of Residence:  [place]   Current SI (including self-harm):  No  Current HI:  No  Consent to Intern Participation: N/A   Jacquelynn Cree, LRT/CTRS 06/06/2015, 11:55 AM

## 2015-06-06 NOTE — Plan of Care (Signed)
Problem: Consults Goal: [facility] Treatment Patient Education Outcome: Progressing Patient increasingly participating in plan of care. Patient's states discharge is her goal.
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
## 2015-06-06 NOTE — [facility] Group Notes (Signed)
[facility] LCSW Group Therapy  06/06/2015 9:28 AM  Type of Therapy:  Group Therapy  Participation Level:  Did Not Attend  Participation Quality:    Affect:    Cognitive:    Insight:    Engagement in Therapy:    Modes of Intervention:    Summary of Progress/Problems:  Cheron Schaumann 06/06/2015, 9:28 AM

## 2015-06-07 ENCOUNTER — Encounter: Payer: Self-pay | Admitting: Internal Medicine

## 2015-06-07 ENCOUNTER — Inpatient Hospital Stay
Admission: AD | Admit: 2015-06-07 | Discharge: 2015-06-13 | DRG: 057 | Disposition: A | Discharge status: Skilled Nursing Facility | Payer: No Typology Code available for payment source | Source: Ambulatory Visit | Attending: Internal Medicine | Admitting: Internal Medicine

## 2015-06-07 DIAGNOSIS — J449 Chronic obstructive pulmonary disease, unspecified: Secondary | ICD-10-CM | POA: Diagnosis present

## 2015-06-07 DIAGNOSIS — F99 Mental disorder, not otherwise specified: Secondary | ICD-10-CM

## 2015-06-07 DIAGNOSIS — F22 Delusional disorders: Secondary | ICD-10-CM | POA: Diagnosis present

## 2015-06-07 DIAGNOSIS — A523 Neurosyphilis, unspecified: Principal | ICD-10-CM | POA: Diagnosis present

## 2015-06-07 DIAGNOSIS — Z7982 Long term (current) use of aspirin: Secondary | ICD-10-CM

## 2015-06-07 DIAGNOSIS — Z8673 Personal history of transient ischemic attack (TIA), and cerebral infarction without residual deficits: Secondary | ICD-10-CM

## 2015-06-07 DIAGNOSIS — F329 Major depressive disorder, single episode, unspecified: Secondary | ICD-10-CM | POA: Diagnosis present

## 2015-06-07 DIAGNOSIS — Z95828 Presence of other vascular implants and grafts: Secondary | ICD-10-CM

## 2015-06-07 DIAGNOSIS — F09 Unspecified mental disorder due to known physiological condition: Secondary | ICD-10-CM

## 2015-06-07 DIAGNOSIS — Z809 Family history of malignant neoplasm, unspecified: Secondary | ICD-10-CM

## 2015-06-07 DIAGNOSIS — Z9851 Tubal ligation status: Secondary | ICD-10-CM

## 2015-06-07 DIAGNOSIS — Z79899 Other long term (current) drug therapy: Secondary | ICD-10-CM

## 2015-06-07 DIAGNOSIS — N390 Urinary tract infection, site not specified: Secondary | ICD-10-CM | POA: Diagnosis present

## 2015-06-07 HISTORY — DX: Cerebral infarction, unspecified: I63.9

## 2015-06-07 LAB — PROTIME-INR
INR: 1.09
PROTHROMBIN TIME: 14.3 s (ref 11.4–15.0)

## 2015-06-07 LAB — COMPREHENSIVE METABOLIC PANEL
ALBUMIN: 3.9 g/dL (ref 3.5–5.0)
ALT: 15 U/L (ref 14–54)
AST: 20 U/L (ref 15–41)
Alkaline Phosphatase: 50 U/L (ref 38–126)
Anion gap: 7 (ref 5–15)
BUN: 12 mg/dL (ref 6–20)
CO2: 27 mmol/L (ref 22–32)
CREATININE: 0.72 mg/dL (ref 0.44–1.00)
Calcium: 9.5 mg/dL (ref 8.9–10.3)
Chloride: 104 mmol/L (ref 101–111)
GFR calc non Af Amer: >60 mL/min (ref >60)
GLUCOSE: 142 mg/dL — AB (ref 65–99)
POTASSIUM: 4.1 mmol/L (ref 3.5–5.1)
Sodium: 138 mmol/L (ref 135–145)
Total Bilirubin: 0.5 mg/dL (ref 0.3–1.2)
Total Protein: 6.7 g/dL (ref 6.5–8.1)

## 2015-06-07 LAB — URINALYSIS COMPLETE WITH MICROSCOPIC (ARMC ONLY)
BACTERIA UA: NONE SEEN
BILIRUBIN URINE: NEGATIVE
Glucose, UA: NEGATIVE mg/dL
KETONES UR: NEGATIVE mg/dL
NITRITE: NEGATIVE
PH: 6 (ref 5.0–8.0)
Protein, ur: NEGATIVE mg/dL
SPECIFIC GRAVITY, URINE: 1.005 (ref 1.005–1.030)

## 2015-06-07 LAB — CBC WITH DIFFERENTIAL/PLATELET
BASOS ABS: 0 10*3/uL (ref 0–0.1)
BASOS PCT: 1 %
Eosinophils Absolute: 0.1 10*3/uL (ref 0–0.7)
Eosinophils Relative: 1 %
HEMATOCRIT: 32.5 % — AB (ref 35.0–47.0)
Hemoglobin: 10.8 g/dL — ABNORMAL LOW (ref 12.0–16.0)
Lymphocytes Relative: 23 %
Lymphs Abs: 1.9 10*3/uL (ref 1.0–3.6)
MCH: 29.2 pg (ref 26.0–34.0)
MCHC: 33.4 g/dL (ref 32.0–36.0)
MCV: 87.3 fL (ref 80.0–100.0)
MONO ABS: 0.6 10*3/uL (ref 0.2–0.9)
MONOS PCT: 7 %
NEUTROS ABS: 5.7 10*3/uL (ref 1.4–6.5)
Neutrophils Relative %: 68 %
Platelets: 241 10*3/uL (ref 150–440)
RBC: 3.72 MIL/uL — AB (ref 3.80–5.20)
RDW: 14.2 % (ref 11.5–14.5)
WBC: 8.4 10*3/uL (ref 3.6–11.0)

## 2015-06-07 LAB — APTT: aPTT: 26 seconds (ref 24–36)

## 2015-06-07 LAB — RAPID HIV SCREEN (HIV 1/2 AB+AG)
HIV 1/2 Antibodies: NONREACTIVE
HIV-1 P24 Antigen - HIV24: NONREACTIVE

## 2015-06-07 LAB — URINE CULTURE

## 2015-06-07 MED ORDER — VITAMIN B-12 1000 MCG PO TABS: 1000.0000 ug | ORAL_TABLET | Freq: Every day | ORAL | Status: DC

## 2015-06-07 MED ORDER — ACETAMINOPHEN 650 MG RE SUPP: 650.0000 mg | Freq: Four times a day (QID) | RECTAL | Status: DC | PRN

## 2015-06-07 MED ORDER — HEPARIN SODIUM (PORCINE) 5000 UNIT/ML IJ SOLN
5000.0000 [IU] | Freq: Three times a day (TID) | INTRAMUSCULAR | Status: DC
  Administered 2015-06-07 – 2015-06-13 (×18): 5000 [IU] via SUBCUTANEOUS
  Filled 2015-06-07 (×18): qty 1

## 2015-06-07 MED ORDER — MIRTAZAPINE 15 MG PO TABS: 15.0000 mg | ORAL_TABLET | Freq: Every day | ORAL | Status: DC

## 2015-06-07 MED ORDER — ALBUTEROL SULFATE (2.5 MG/3ML) 0.083% IN NEBU: 2.5000 mg | INHALATION_SOLUTION | RESPIRATORY_TRACT | Status: DC | PRN

## 2015-06-07 MED ORDER — CYANOCOBALAMIN 1000 MCG/ML IJ SOLN
1000.0000 ug | Freq: Every day | INTRAMUSCULAR | Status: DC
  Administered 2015-06-07: 1000 ug via INTRAMUSCULAR
  Filled 2015-06-07: qty 1

## 2015-06-07 MED ORDER — POTASSIUM CHLORIDE ER 10 MEQ PO TBCR
10.0000 meq | EXTENDED_RELEASE_TABLET | Freq: Two times a day (BID) | ORAL | Status: DC
  Administered 2015-06-07 – 2015-06-11 (×8): 10 meq via ORAL
  Filled 2015-06-07 (×17): qty 1

## 2015-06-07 MED ORDER — ONDANSETRON HCL 4 MG PO TABS: 4.0000 mg | ORAL_TABLET | Freq: Four times a day (QID) | ORAL | Status: DC | PRN

## 2015-06-07 MED ORDER — TRAZODONE HCL 50 MG PO TABS
50.0000 mg | ORAL_TABLET | Freq: Every day | ORAL | Status: DC
  Administered 2015-06-07 – 2015-06-08 (×2): 50 mg via ORAL
  Filled 2015-06-07 (×2): qty 1

## 2015-06-07 MED ORDER — ZOLPIDEM TARTRATE 5 MG PO TABS: 5.0000 mg | ORAL_TABLET | Freq: Every evening | ORAL | Status: DC | PRN

## 2015-06-07 MED ORDER — ASPIRIN EC 81 MG PO TBEC
81.0000 mg | DELAYED_RELEASE_TABLET | Freq: Every day | ORAL | Status: DC
  Administered 2015-06-07: 81 mg via ORAL
  Filled 2015-06-07: qty 1

## 2015-06-07 MED ORDER — DEXTROSE 5 % IV SOLN
24.0000 10*6.[IU] | Freq: Two times a day (BID) | INTRAVENOUS | Status: DC
  Filled 2015-06-07 (×2): qty 24

## 2015-06-07 MED ORDER — RISPERIDONE 0.5 MG PO TABS
0.5000 mg | ORAL_TABLET | Freq: Two times a day (BID) | ORAL | Status: DC
  Administered 2015-06-07 – 2015-06-13 (×12): 0.5 mg via ORAL
  Filled 2015-06-07 (×12): qty 1

## 2015-06-07 MED ORDER — ONDANSETRON HCL 4 MG/2ML IJ SOLN: 4.0000 mg | Freq: Four times a day (QID) | INTRAMUSCULAR | Status: DC | PRN

## 2015-06-07 MED ORDER — DEXTROSE 5 % IV SOLN
24.0000 10*6.[IU] | Freq: Two times a day (BID) | INTRAVENOUS | Status: DC
  Administered 2015-06-07: 24 10*6.[IU] via INTRAVENOUS
  Filled 2015-06-07 (×4): qty 24

## 2015-06-07 MED ORDER — ACETAMINOPHEN 325 MG PO TABS
650.0000 mg | ORAL_TABLET | Freq: Four times a day (QID) | ORAL | Status: DC | PRN
  Administered 2015-06-09 – 2015-06-13 (×3): 650 mg via ORAL
  Filled 2015-06-07 (×3): qty 2

## 2015-06-07 NOTE — Progress Notes (Signed)
Patient has been alert to person.  She appears labile with moments of crying and then confused about her whereabouts.   Patient has flat affect.  She denies auditory or visual hallucinations when asked. A case worker from infectious control by the name of Neta Mends [phone])  About pt's positive RPR. Dr. Sampson Goon notified.  Patient was assisted with toiletries and took a shower.  Medication compliant.  Will cont to monitor for safety.

## 2015-06-07 NOTE — [facility] Group Notes (Signed)
[facility] LCSW Aftercare Discharge Planning Group Note   06/07/2015 5:15 PM  Participation Quality:  Did not attend.   Autumn Pruitt MSW, [address]

## 2015-06-07 NOTE — Progress Notes (Signed)
Recreation Therapy Notes  Date: 07.22.16 Time: 3:00 pm Location: Craft Room  Group Topic: [place]  Goal Area(s) Addresses:  Patients will learn a healthy coping skill. Patients will verbalized benefit of coloring.  Behavioral Response: Did not attend  Intervention: [place]  Activity: Patients colored a mandala to learn a new coping skill.  Education: LRT educated patients on the benefits of coloring.   Education Outcome: Patient did not attend group.   Clinical Observations/Feedback: Patient did not attend group.  Jacquelynn Cree, LRT/CTRS 06/07/2015 4:14 PM

## 2015-06-07 NOTE — Progress Notes (Signed)
D: Pt is awake and active in the milieu this evening. Pt mood is restless and her affect is bizarre. Pt needs some redirection due to intrusive behaviors. She attempts to enter the nurses station at times seeking attention from staff. Pt is delusional and believes her husband is coming tonight, but she is also alert and oriented to her surroundings.  A: Writer provided emotional support and administered medications.   R: Pt is ultimately redirectable and spent most of the evening in bed. Will continue to monitor.

## 2015-06-07 NOTE — [facility] Group Notes (Signed)
[facility] LCSW Group Therapy (Late entry 06/06/2015)  06/07/2015 5:15 PM  Type of Therapy:  Group Therapy  Participation Level:  Did Not Attend  Modes of Intervention:  Discussion, Education, Socialization and Support  Summary of Progress/Problems: Emotional Regulation: Patients will identify both negative and positive emotions. They will discuss emotions they have difficulty regulating and how they impact their lives. Patients will be asked to identify healthy coping skills to combat unhealthy reactions to negative emotions.     [employer] MSW, LCSWA  06/07/2015, 5:15 PM

## 2015-06-07 NOTE — [facility] Group Notes (Signed)
[facility] LCSW Group Therapy  06/07/2015 3:39 PM  Type of Therapy:  Group Therapy  Participation Level:  Did Not Attend   Lulu Riding, MSW, LCSWA 06/07/2015, 3:39 PM

## 2015-06-07 NOTE — Progress Notes (Signed)
Pt seen and examined. Full note to follow.  Highly suspicious of neurosyphilis. WIll ask medicine to transfer to floor for LP by IR and for IV pcn G 24 mu continuous WIll need picc placed as well Check HIV

## 2015-06-07 NOTE — [facility] Group Notes (Signed)
[facility] Group Notes:  (Nursing/MHT/Case Management/Adjunct)  Date:  06/07/2015  Time:  11:40 AM  Type of Therapy:  Group Therapy  Participation Level:  Did Not Attend  Summary of Progress/Problems:  Autumn Pruitt Autumn Pruitt Autumn Pruitt 06/07/2015, 11:40 AM

## 2015-06-07 NOTE — [facility] Group Notes (Signed)
[facility] Group Notes:  (Nursing/MHT/Case Management/Adjunct)  Date:  06/07/2015  Time:  6:09 AM  Type of Therapy:  Group Therapy  Participation Level:  Minimal    Summary of Progress/Problems:Left Early   Veva Holes 06/07/2015, 6:09 AM

## 2015-06-07 NOTE — Progress Notes (Signed)
Nursing supervisor notified about pt's need for IV antibiotics.  Awaiting Hospitalist for transfer/Admit orders.

## 2015-06-07 NOTE — Progress Notes (Addendum)
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 She was admitted floridly psychotic in the context of urinary tract infection.  Yesterday Autumn Pruitt was rather sensible, pleasant, and engaging. Today however she is in distress crying about her husband who went shopping. As far as we know she is not married. She thinks that she is getting married tomorrow. She is been crying all morning long. She feels sad. She denies feeling suicidal or homicidal. She is confused about her whereabouts. She was treated for UTI with 10 day course of Keflex with no improvement. She received a dose of fosfomycin. Her urine culture is contaminated and the second summer will be taken. We discovered that the patient has low vitamin B12 level and that her RPR titer is over 1:250. Will ask infectious disease consult. I am not sure if UTI delirium or possibly tertiary syphilis better explains her symptoms.  Principal Problem: Psychosis, organic Diagnosis:   Patient Active Problem List   Diagnosis Date Noted  . UTI (urinary tract infection) [N39.0] 06/05/2015  . 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 Stroke [I63.9] 06/03/2015   Total Time spent with patient: 20 minutes   Past Medical History:  Past Medical History  Diagnosis Date  . CVA (cerebral infarction)     Past Surgical History  Procedure Laterality Date  . Tubal ligation     Family History:  Family History  Problem Relation Age of Onset  . Family history unknown: Yes   Social History:  History  Alcohol Use No     History  Drug Use No    History   Social History  . Marital Status: Widowed    Spouse Name: N/A  . Number of Children: N/A  . Years of Education: N/A   Social History Main Topics  . Smoking status: Never Smoker   . Smokeless tobacco: Never Used  . Alcohol Use: No  . Drug Use: No   . Sexual Activity: Not Currently   Other Topics Concern  . None   Social History Narrative   Additional History:    Sleep: Poor  Appetite:  Poor   Assessment:   Musculoskeletal: Strength & Muscle Tone: within normal limits Gait & Station: normal Patient leans: N/A   Psychiatric Specialty Exam: Physical Exam  Nursing note and vitals reviewed.   Review of Systems  All other systems reviewed and are negative.   Blood pressure 125/77, pulse 121, temperature 97.7 F (36.5 C), temperature source Oral, resp. rate 18, height  (1.676 m), weight 51.71 kg (114 lb), SpO2 98 %.Body mass index is 18.41 kg/(m^2).  General Appearance: Fairly Groomed  Patent attorney::  Good  Speech:  Garbled  Volume:  Normal  Mood:  Depressed and Hopeless  Affect:  Tearful  Thought Process:  Loose  Orientation:  Full (Time, Place, and Person)  Thought Content:  Negative and Ideas of Reference:   Paranoia Delusions  Suicidal Thoughts:  No  Homicidal Thoughts:  No  Memory:  Immediate;   Poor Recent;   Poor Remote;   Poor  Judgement:  Poor  Insight:  Lacking  Psychomotor Activity:  Decreased  Concentration:  Poor  Recall:  Poor  Fund of Knowledge:Fair  Language: Fair  Akathisia:  No  Handed:  Right  AIMS (if indicated):     Assets:  Desire for Improvement Financial  Resources/Insurance Housing Social Support  ADL's:  Intact  Cognition: WNL  Sleep:  Number of Hours: 3     Current Medications: Current Facility-Administered Medications  Medication Dose Route Frequency Provider Last Rate Last Dose  . acetaminophen (TYLENOL) tablet 650 mg  650 mg Oral Q6H PRN Audery Amel, MD      . alum & mag hydroxide-simeth (MAALOX/MYLANTA) 200-200-20 MG/5ML suspension 30 mL  30 mL Oral Q4H PRN Audery Amel, MD      . aspirin chewable tablet 81 mg  81 mg Oral Daily Jolanta B Pucilowska, MD   81 mg at 06/07/15 0859  . benztropine (COGENTIN) tablet 0.5 mg  0.5 mg Oral BID Shari Prows,  MD   1 mg at 06/07/15 0858  . cyanocobalamin ((VITAMIN B-12)) injection 1,000 mcg  1,000 mcg Intramuscular Q1200 Jolanta B Pucilowska, MD      . magnesium hydroxide (MILK OF MAGNESIA) suspension 30 mL  30 mL Oral Daily PRN Audery Amel, MD      . mirtazapine (REMERON) tablet 15 mg  15 mg Oral QHS Jolanta B Pucilowska, MD      . risperiDONE (RISPERDAL M-TABS) disintegrating tablet 2 mg  2 mg Oral BID Shari Prows, MD   2 mg at 06/07/15 0857  . traZODone (DESYREL) tablet 100 mg  100 mg Oral QHS Jolanta B Pucilowska, MD   100 mg at 06/07/15 0154  . [START ON 06/10/2015] vitamin B-12 (CYANOCOBALAMIN) tablet 1,000 mcg  1,000 mcg Oral Daily Shari Prows, MD        Lab Results:  Results for orders placed or performed during the hospital encounter of 06/04/15 (from the past 48 hour(s))  Vitamin B12     Status: None   Collection Time: 06/05/15  2:39 PM  Result Value Ref Range   Vitamin B-12 331 180 - 914 pg/mL    Comment: (NOTE) This assay is not validated for testing neonatal or myeloproliferative syndrome specimens for Vitamin B12 levels. Performed at [facility]   Folate     Status: None   Collection Time: 06/05/15  2:39 PM  Result Value Ref Range   Folate 16.2 >5.9 ng/mL  RPR     Status: Abnormal   Collection Time: 06/05/15  2:39 PM  Result Value Ref Range   RPR Ser Ql Reactive (A) Non Reactive    Comment: (NOTE) Performed At: [facility] [address] [place], [place] [id number] Mila Homer MD WU:[id number]   RPR, quant & T.pallidum antibodies     Status: Abnormal   Collection Time: 06/05/15  2:39 PM  Result Value Ref Range   Rapid Plasma Reagin, Quant 1:256 (H) NonRea<1:1   T Pallidum Abs Positive (A) Negative    Comment: (NOTE) Due to reagent shortages, an alternative multiplex flow immunoassay has been implemented for the detection of Treponema pallidum antibodies.  **Possible results include: Non Reactive (Negative)                                  Equivocal (Equivocal)                                  Reactive (Positive) Performed At: [facility] [address] [place], [place] [id number] Mila Homer MD VH:[id number]   Urine culture     Status: None   Collection  Time: 06/05/15  2:46 PM  Result Value Ref Range   Specimen Description URINE, CLEAN CATCH    Special Requests keflex    Culture MULTIPLE SPECIES PRESENT, SUGGEST RECOLLECTION    Report Status 06/07/2015 FINAL     Physical Findings: AIMS:  , ,  ,  ,    CIWA:    COWS:     Treatment Plan Summary: Daily contact with patient to assess and evaluate symptoms and progress in treatment and Medication management   Medical Decision Making:  New problem, with additional work up planned, Review of Psycho-Social Stressors (1), Review or order clinical lab tests (1), Review of Medication Regimen & Side Effects (2) and Review of New Medication or Change in Dosage (2)   Ms. Brenes is a 64 year old female with history of cognitive decline and psychosis she developed following her stroke. She was admitted for worsening psychosis, disorganized, erratic behavior and agitation possibly in the context of recent UTI.  1. Agitation. This has resolved.   2. Psychosis. The patient was maintained on low-dose Risperdal at home. Risperdal was increased to 2 mg twice daily to address psychotic symptoms. Wel added cogentin for slight stiffness. We started Remeron for depression.  3. Insomnia. Will switch from trazodone to Ambien.  4. Stroke prevention. We continue aspirin.  5. UTI. The patient was seen in the emergency room 10 days ago. She was prescribed Keflex. We note that she pick it up from the pharmacy. She returned to the emergency room with symptoms of UTI again. Keflex was discontinued and she was given a single dose of fosfomycin. Urine culture pending   6. Positive RPR. ID consult is greatly appreciated.  7. Low B12. We started B12 injections for 3 days  followed by oral supplementation.   8. Disposition. She will likely be discharged to home.She will follow-up with [facility].     Jolanta Pucilowska 06/07/2015, 2:09 PM

## 2015-06-07 NOTE — H&P (Signed)
[facility] [place] at [place] [facility]   PATIENT NAME: Autumn Pruitt    MR#:  [id number]  DATE OF BIRTH:  04/01/1951  DATE OF ADMISSION:  06/07/2015  PRIMARY CARE PHYSICIAN: No PCP Per Patient   REQUESTING/REFERRING PHYSICIAN: Shari Prows, MD  CHIEF COMPLAINT:  No chief complaint on file.  positive RPR  HISTORY OF PRESENT ILLNESS:  Autumn Pruitt  is a 64 y.o. female with a known history of CVA, mild psychosis and  UTI. The patient was admitted for psychosis 2 days ago and has been treated with Risperdal and Remeron for psychosis symptoms and depression. Patient was diagnosed with a UTI 10 days ago and was treated with Keflex. She returned to the emergency room with symptoms of UTI again. Keflex was discontinued and she was given a single dose of fosfomycin. She was found to have a positive RPR in behavior medicine unit. Dr. Sampson Goon suggest the admitting the patient to medical floor and started penicillin G IVPB. The patient is confused, but follow commands. She denies any headache, dizziness, weakness or numbness.  PAST MEDICAL HISTORY:   Past Medical History  Diagnosis Date  . CVA (cerebral infarction)   . Stroke     PAST SURGICAL HISTORY:   Past Surgical History  Procedure Laterality Date  . Tubal ligation      SOCIAL HISTORY:   History  Substance Use Topics  . Smoking status: Never Smoker   . Smokeless tobacco: Never Used  . Alcohol Use: No    FAMILY HISTORY:   Family History  Problem Relation Age of Onset  . Cancer Father     DRUG ALLERGIES:  No Known Allergies  REVIEW OF SYSTEMS:  CONSTITUTIONAL: No fever, fatigue or weakness.  EYES: No blurred or double vision.  EARS, NOSE, AND THROAT: No tinnitus or ear pain.  RESPIRATORY: No cough, shortness of breath, wheezing or hemoptysis.  CARDIOVASCULAR: No chest pain, orthopnea, edema.  GASTROINTESTINAL: No nausea, vomiting, diarrhea or abdominal pain.  GENITOURINARY: No  dysuria, hematuria.  ENDOCRINE: No polyuria, nocturia,  HEMATOLOGY: No anemia, easy bruising or bleeding SKIN: No rash or lesion. MUSCULOSKELETAL: No joint pain or arthritis.   NEUROLOGIC: No tingling, numbness, no focal weakness. No incontinence. PSYCHIATRY: No anxiety or depression.   MEDICATIONS AT HOME:   Prior to Admission medications   Medication Sig Start Date End Date Taking? Authorizing Provider  aspirin EC 81 MG tablet Take 1 tablet (81 mg total) by mouth daily. 05/25/15 05/24/16  Sharyn Creamer, MD  cephALEXin (KEFLEX) 500 MG capsule Take 1 capsule (500 mg total) by mouth 4 (four) times daily. 05/26/15   Loleta Rose, MD  potassium chloride (K-DUR) 10 MEQ tablet Take 1 tablet (10 mEq total) by mouth 2 (two) times daily. 05/25/15   Loleta Rose, MD  risperiDONE (RISPERDAL) 1 MG tablet Take 0.5 mg by mouth 2 (two) times daily.    Historical Provider, MD  traZODone (DESYREL) 50 MG tablet Take 1 tablet (50 mg total) by mouth at bedtime. 05/25/15   Loleta Rose, MD      VITAL SIGNS:  There were no vitals taken for this visit.  PHYSICAL EXAMINATION:  GENERAL:  64 y.o.-year-old patient lying in the bed with no acute distress.  EYES: Pupils equal, round, reactive to light and accommodation. No scleral icterus. Extraocular muscles intact.  HEENT: Head atraumatic, normocephalic. Oropharynx and nasopharynx clear.  NECK:  Supple, no jugular venous distention. No thyroid enlargement, no tenderness.  LUNGS: Normal breath sounds  bilaterally, no wheezing, rales,rhonchi or crepitation. No use of accessory muscles of respiration.  CARDIOVASCULAR: S1, S2 normal. No murmurs, rubs, or gallops.  ABDOMEN: Soft, nontender, nondistended. Bowel sounds present. No organomegaly or mass.  EXTREMITIES: No pedal edema, cyanosis, or clubbing.  NEUROLOGIC: Cranial nerves II through XII are intact. Muscle strength 5/5 in all extremities. Sensation intact. Gait not checked.  PSYCHIATRIC: The patient is alert and  oriented x 2. She is confused. SKIN: No obvious rash, lesion, or ulcer.   LABORATORY PANEL:   CBC  Recent Labs Lab 06/03/15 1407  WBC 7.6  HGB 11.5*  HCT 34.9*  PLT 296   ------------------------------------------------------------------------------------------------------------------  Chemistries   Recent Labs Lab 06/03/15 1407  NA 138  K 3.8  CL 106  CO2 26  GLUCOSE 97  BUN 14  CREATININE 0.77  CALCIUM 9.5  AST 21  ALT 20  ALKPHOS 51  BILITOT 0.8   ------------------------------------------------------------------------------------------------------------------  Cardiac Enzymes No results for input(s): TROPONINI in the last 168 hours. ------------------------------------------------------------------------------------------------------------------  RADIOLOGY:  No results found.  EKG:   Orders placed or performed during the hospital encounter of 05/25/15  . ED EKG  . ED EKG  . EKG    IMPRESSION AND PLAN:   Neurosyphilis UTI Psychosis History of CVA  The patient will be admitted to medical floor. Per Dr. Sampson Goon, start penicillin G 24 million units IVPB and the PICC line tomorrow. Lumbar puncture by interventional radiologist. Check HIV.  For UTI, I will repeat urinalysis and hold Keflex for now. For history of CVA, continue aspirin. Continue psychosis medications.   All the records are reviewed and case discussed with ED provider. Management plans discussed with the patient, family and they are in agreement.  CODE STATUS: Full code  TOTAL TIME TAKING CARE OF THIS PATIENT: 55 minutes.    Shaune Pollack M.D on 06/07/2015 at 7:18 PM  Between 7am to 6pm - Pager - [phone]  After 6pm go to [email or web address] - password [email or web address] [facility]  [facility]  Office  [phone]  CC: Primary care physician; No PCP Per Patient

## 2015-06-07 NOTE — Progress Notes (Signed)
Dr. Toni Amend notified of pt's need for discharge.  Bed Placement aware.  Awaiting on discharge orders.

## 2015-06-07 NOTE — Plan of Care (Signed)
Problem: Alteration in thought process Goal: STG-Patient is able to follow short directions Outcome: Progressing Pt needs frequent redirection but follows simple commands.

## 2015-06-07 NOTE — Consult Note (Signed)
[facility] Infectious Disease     Reason for Consult:Syphilis   Referring Physician: Pucilowsli Date of Admission:  06/07/2015   Principal Problem:   Neurosyphilis   HPI: Autumn Pruitt is a 64 y.o. female with hx CVA 2014 treated with TPA who was admitted to psych unit with increasing confusion, paranoia on 7/19. Had been worsening since her stroke. She has also had some difficulty walking around and some back pain. She is a poor historian. Denies, fevers, chills, Shooting pains in legs.  Reports not sexually acitve for 20 years. Denies ever having syphilis in past.  Past Medical History  Diagnosis Date  . CVA (cerebral infarction)   . Stroke    Past Surgical History  Procedure Laterality Date  . Tubal ligation     History  Substance Use Topics  . Smoking status: Never Smoker   . Smokeless tobacco: Never Used  . Alcohol Use: No   Family History  Problem Relation Age of Onset  . Cancer Father     Allergies: No Known Allergies  Current antibiotics: Antibiotics Given (last 72 hours)    None      MEDICATIONS: . aspirin EC  81 mg Oral Daily  . heparin  5,000 Units Subcutaneous 3 times per day  . pencillin G potassium IV  24 Million Units Intravenous Q12H  . potassium chloride  10 mEq Oral BID  . risperiDONE  0.5 mg Oral BID  . traZODone  50 mg Oral QHS    Review of Systems - 11 systems reviewed and negative per HPI   OBJECTIVE: Temp:  [97.2 F (36.2 C)-98 F (36.7 C)] 98 F (36.7 C) (07/22 2010) Pulse Rate:  [72-121] 87 (07/22 2010) Resp:  [16-18] 16 (07/22 2010) BP: (84-125)/(41-77) 84/56 mmHg (07/22 2010) SpO2:  [100 %] 100 % (07/22 2010) Weight:  [51.619 kg (113 lb 12.8 oz)] 51.619 kg (113 lb 12.8 oz) (07/22 2011) Physical Exam  Constitutional:  She is confused, standing at foot of bed trying to spread a sheet but cannot seem to proceed. Bent over at waist. HENT: [place]/AT, PERRLA, no scleral icterus Mouth/Throat: Oropharynx is clear and moist. No  oropharyngeal exudate.  Cardiovascular: Normal rate, regular rhythm and normal heart sounds. 2/6 sm Pulmonary/Chest: Effort normal and breath sounds normal. No respiratory distress.  has no wheezes.  Neck = supple, no nuchal rigidity Abdominal: Soft. Bowel sounds are normal.  exhibits no distension. There is no tenderness.  Lymphadenopathy: no cervical adenopathy. No axillary adenopathy Neurological:very confused, cannot tell me where she is, can follow commands however.  Skin: Skin is warm and dry. No rash noted. No erythema.  Psychiatric: bizarre behavior.     LABS: Results for orders placed or performed during the hospital encounter of 06/07/15 (from the past 48 hour(s))  Urinalysis complete, with microscopic ([facility] only)     Status: Abnormal   Collection Time: 06/07/15  9:04 PM  Result Value Ref Range   Color, Urine STRAW (A) YELLOW   APPearance CLEAR (A) CLEAR   Glucose, UA NEGATIVE NEGATIVE mg/dL   Bilirubin Urine NEGATIVE NEGATIVE   Ketones, ur NEGATIVE NEGATIVE mg/dL   Specific Gravity, Urine 1.005 1.005 - 1.030   Hgb urine dipstick 1+ (A) NEGATIVE   pH 6.0 5.0 - 8.0   Protein, ur NEGATIVE NEGATIVE mg/dL   Nitrite NEGATIVE NEGATIVE   Leukocytes, UA 3+ (A) NEGATIVE   RBC / HPF 0-5 0 - 5 RBC/hpf   WBC, UA 6-30 0 - 5 WBC/hpf  Bacteria, UA NONE SEEN NONE SEEN   Squamous Epithelial / LPF 0-5 (A) NONE SEEN   Mucous PRESENT    No components found for: ESR, C REACTIVE PROTEIN MICRO: Recent Results (from the past 720 hour(s))  Urine culture     Status: None   Collection Time: 05/25/15  3:01 PM  Result Value Ref Range Status   Specimen Description URINE, RANDOM  Final   Special Requests Normal  Final   Culture   Final    20,000 COLONIES/mL COAGULASE NEGATIVE STAPHYLOCOCCUS CALL MICROBIOLOGY LAB IF SENSITIVITIES ARE REQUIRED.    Report Status 05/27/2015 FINAL  Final  Urine culture     Status: None   Collection Time: 06/05/15  2:46 PM  Result Value Ref Range Status    Specimen Description URINE, CLEAN CATCH  Final   Special Requests keflex  Final   Culture MULTIPLE SPECIES PRESENT, SUGGEST RECOLLECTION  Final   Report Status 06/07/2015 FINAL  Final    IMAGING: Ct Head Wo Contrast  05/25/2015   CLINICAL DATA:  Insomnia and altered mental status over the past few days. Remote CVA history.  EXAM: CT HEAD WITHOUT CONTRAST  TECHNIQUE: Contiguous axial images were obtained from the base of the skull through the vertex without intravenous contrast.  COMPARISON:  MRI brain 10/18/2013 and head CT 10/18/2013  FINDINGS: The ventricles are normal in size and configuration. No extra-axial fluid collections are identified. The gray-white differentiation is normal. No CT findings for acute intracranial process such as hemorrhage or infarction. No mass lesions. The brainstem and cerebellum are grossly normal.  The bony structures are intact. The paranasal sinuses and mastoid air cells are clear. The globes are intact. Stable calcified scalp lesion at the right vertex.  IMPRESSION: No acute intracranial findings or mass lesions.   Electronically Signed   By: Marijo Sanes M.D.   On: 05/25/2015 14:18    Assessment:   Autumn Pruitt is a 64 y.o. female + RPR 1/256, CVA in 2014, increasing paranoia and delusions. 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    Recommendations Start IV PCN 24 Mu q 24 continuous.   Order LP to assess for neurosyphilis - If abnml cell count, protein or + VDRL will need 14 days IV PCN which can be given through PICC line Thank you very much for allowing me to participate in the care of this patient. Please call with questions.   Cheral Marker. Ola Spurr, MD

## 2015-06-08 ENCOUNTER — Inpatient Hospital Stay: Payer: MEDICAID

## 2015-06-08 ENCOUNTER — Inpatient Hospital Stay: Payer: Self-pay

## 2015-06-08 LAB — CSF CELL COUNT WITH DIFFERENTIAL
Eosinophils, CSF: 1 %
Lymphs, CSF: 12 %
Monocyte-Macrophage-Spinal Fluid: 5 %
OTHER CELLS CSF: 0
RBC Count, CSF: 63683 /mm3 — ABNORMAL HIGH (ref 0–3)
Segmented Neutrophils-CSF: 82 %
TUBE #: 3
WBC, CSF: 111 /mm3

## 2015-06-08 LAB — PROTEIN, CSF: Total  Protein, CSF: 688 mg/dL — ABNORMAL HIGH (ref 15–45)

## 2015-06-08 LAB — GLUCOSE, CSF: Glucose, CSF: 75 mg/dL — ABNORMAL HIGH (ref 40–70)

## 2015-06-08 MED ORDER — PENICILLIN G POTASSIUM 5000000 UNITS IJ SOLR
24.0000 10*6.[IU] | INTRAVENOUS | Status: DC
  Administered 2015-06-08 – 2015-06-12 (×5): 24 10*6.[IU] via INTRAVENOUS
  Filled 2015-06-08 (×6): qty 24

## 2015-06-08 MED ORDER — LORAZEPAM 2 MG/ML IJ SOLN: 2.0000 mg | Freq: Once | INTRAMUSCULAR | Status: DC

## 2015-06-08 MED ORDER — ASPIRIN 81 MG PO CHEW
81.0000 mg | CHEWABLE_TABLET | Freq: Every day | ORAL | Status: DC
  Administered 2015-06-08 – 2015-06-13 (×6): 81 mg via ORAL
  Filled 2015-06-08 (×6): qty 1

## 2015-06-08 MED ORDER — ENSURE ENLIVE PO LIQD
237.0000 mL | Freq: Two times a day (BID) | ORAL | Status: DC
  Administered 2015-06-08 – 2015-06-13 (×10): 237 mL via ORAL

## 2015-06-08 NOTE — Progress Notes (Signed)
   06/07/15 2100  Clinical Encounter Type  Visited With Patient  Visit Type Follow-up;Spiritual support  Referral From Nurse  Consult/Referral To Chaplain  Spiritual Encounters  Spiritual Needs Prayer;Emotional  Stress Factors  Patient Stress Factors Exhausted;Family relationships;Health changes;Major life changes  Met again w/patient after xfer from [facility]. Provided pastoral care & prayer.  [facility]

## 2015-06-08 NOTE — Progress Notes (Signed)
[facility] - [place] at [facility]   PATIENT NAME: Autumn Pruitt    MR#:  [id number]  DATE OF BIRTH:  10-05-1951  SUBJECTIVE:  Very calm and pleasant. Sitter+  REVIEW OF SYSTEMS:   Review of Systems  Constitutional: Negative for fever, chills and weight loss.  HENT: Negative for ear discharge, ear pain and nosebleeds.   Eyes: Negative for blurred vision, pain and discharge.  Respiratory: Negative for sputum production, shortness of breath, wheezing and stridor.   Cardiovascular: Negative for chest pain, palpitations, orthopnea and PND.  Gastrointestinal: Negative for nausea, vomiting, abdominal pain and diarrhea.  Genitourinary: Negative for urgency and frequency.  Musculoskeletal: Negative for back pain and joint pain.  Neurological: Negative for sensory change, speech change, focal weakness and weakness.  Psychiatric/Behavioral: Negative for depression and hallucinations. The patient is not nervous/anxious.   All other systems reviewed and are negative.  Tolerating Diet:yes  DRUG ALLERGIES:  No Known Allergies  VITALS:  Blood pressure 94/34, pulse 70, temperature 98.2 F (36.8 C), temperature source Oral, resp. rate 16, height  (1.676 m), weight 51.619 kg (113 lb 12.8 oz), SpO2 100 %.  PHYSICAL EXAMINATION:   Physical Exam  GENERAL:  64 y.o.-year-old patient lying in the bed with no acute distress.  EYES: Pupils equal, round, reactive to light and accommodation. No scleral icterus. Extraocular muscles intact.  HEENT: Head atraumatic, normocephalic. Oropharynx and nasopharynx clear.  NECK:  Supple, no jugular venous distention. No thyroid enlargement, no tenderness.  LUNGS: Normal breath sounds bilaterally, no wheezing, rales, rhonchi. No use of accessory muscles of respiration.  CARDIOVASCULAR: S1, S2 normal. No murmurs, rubs, or gallops.  ABDOMEN: Soft, nontender, nondistended. Bowel sounds present. No organomegaly or mass.  EXTREMITIES:  No cyanosis, clubbing or edema b/l.    NEUROLOGIC: Cranial nerves II through XII are intact. No focal Motor or sensory deficits b/l.   PSYCHIATRIC: The patient is alert and oriented x 2 SKIN: No obvious rash, lesion, or ulcer.    LABORATORY PANEL:   CBC  Recent Labs Lab 06/07/15 2148  WBC 8.4  HGB 10.8*  HCT 32.5*  PLT 241    Chemistries   Recent Labs Lab 06/07/15 2148  NA 138  K 4.1  CL 104  CO2 27  GLUCOSE 142*  BUN 12  CREATININE 0.72  CALCIUM 9.5  AST 20  ALT 15  ALKPHOS 50  BILITOT 0.5    Cardiac Enzymes No results for input(s): TROPONINI in the last 168 hours.  RADIOLOGY:  Dg Fluoro Guide Lumbar Puncture  06/08/2015   CLINICAL DATA:  Altered mental status, confusion, psychosis, neuro syphilis  EXAM: DIAGNOSTIC LUMBAR PUNCTURE UNDER FLUOROSCOPIC GUIDANCE  FLUOROSCOPY TIME:  If the device does not provide the exposure index:  Fluoroscopy Time (in minutes and seconds):  24 seconds  Number of Acquired Images:  1.0  PROCEDURE: Informed consent was obtained from the patient prior to the procedure, including potential complications of headache, allergy, and pain. With the patient prone, the lower back was prepped with Betadine. 1% Lidocaine was used for local anesthesia. Lumbar puncture was performed at the L5-S1 level using a 20 gauge needle with return of blood-tinged CSF with an opening pressure of 10 cm water. 15 Ml of CSF were obtained for laboratory studies. The patient tolerated the procedure well and there were no apparent complications.  IMPRESSION: Successful fluoroscopic L5-S1 lumbar puncture. Blood-tinged CSF obtained which partially cleared while obtaining the 4 samples compatible with a traumatic tap.  Normal opening pressure   Electronically Signed   By: Judie Petit.  Shick M.D.   On: 06/08/2015 12:56     ASSESSMENT AND PLAN:  64 y.o. female with a known history of CVA, mild psychosis and UTI. The patient was admitted for psychosis 2 days ago and has been treated  with Risperdal and Remeron for psychosis symptoms and depression.  1. 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 CSF VDRL,Cellcount and protein sent\ -pt may need PICC line for IV PCN at home  2 History of CVA -po asa -no neurodeficits  Management plans discussed with the patient, family and they are in agreement.  CODE STATUS: full  DVT Prophylaxis: hepairn  TOTAL TIME TAKING CARE OF THIS PATIENT: 35 minutes.  >50% time spent on counselling and coordination of care pt and dter Laurelyn Sickle  POSSIBLE D/C IN 1-2 DAYS, DEPENDING ON CLINICAL CONDITION.   Autumn Pruitt M.D on 06/08/2015 at 1:54 PM  Between 7am to 6pm - Pager - [phone]  After 6pm go to [email or web address] - password [email or web address] [facility]  [facility]  Office  [phone]  CC: Primary care physician; No PCP Per Patient

## 2015-06-08 NOTE — Progress Notes (Signed)
Clinical Child psychotherapist (CSW) received consult to IVC patient. Per 2C unit clerk patient's IVC has been completed. CSW will continue to follow and assist as needed.   Jetta Lout, LCSWA [phone]

## 2015-06-08 NOTE — Procedures (Signed)
Successful fluoro LP AT L5S1 Traumatic bloody tap which slowly improves over the 4 samples obatined 15cc blood tinged CSF aspirated Opening pressure 10cm h2o Labs sent Full report in pacs

## 2015-06-08 NOTE — Progress Notes (Signed)
Pt transferred from [facility] to [facility].  Alert and oriented x3. VSS but BP remains at low 90s/60. Pt states no thoughts of suicide or thoughts of harming others at this time.  Sitter at bedside d/t pt being IVC.  Continues on IV penicillin.  Voiding without difficulty.  Receives assistance with walking in room.  No complaints voiced at this time.  Will cont to monitor.

## 2015-06-08 NOTE — Progress Notes (Signed)
Initial Nutrition Assessment    INTERVENTION:   Medical Food Supplement Therapy: recommend addition of Ensure Enlive po BID, each supplement provides 350 kcal and 20 grams of protein, due to documented wt loss  NUTRITION DIAGNOSIS:   Will reassess on follow-up  GOAL:   Patient will meet greater than or equal to 90% of their needs   MONITOR:    (Energy Intakem, Anthropometrics, Electrolyte/Renal Profile, Glucose Profile)  REASON FOR ASSESSMENT:   Malnutrition Screening Tool    ASSESSMENT:    Pt admitted with neurosyphillis, recent admission to behavioral medicine for increasing paranoia and delusions; pt down in radiology on visit today  Past Medical History  Diagnosis Date  . CVA (cerebral infarction)   . Stroke     Diet Order:  Diet Heart Room service appropriate?: Yes; Fluid consistency:: Thin   Energy Intake: recorded po intake 100% at [place] this AM, noted pt also ate sandwich box meal during the night  Food and nutrition related history:   Electrolyte and Renal Profile:  Recent Labs Lab 06/03/15 1407 06/07/15 2148  BUN 14 12  CREATININE 0.77 0.72  NA 138 138  K 3.8 4.1   Glucose Profile: No results for input(s): GLUCAP in the last 72 hours. Protein Profile:  Recent Labs Lab 06/03/15 1407 06/07/15 2148  ALBUMIN 4.6 3.9    Skin:  Reviewed, no issues  Height:   Ht Readings from Last 1 Encounters:  06/07/15  (1.676 m)    Weight:   Wt Readings from Last 1 Encounters:  06/07/15 113 lb 12.8 oz (51.619 kg)    Ideal Body Weight:   weight trending down per last weight encounters; 13% wt loss, but unable to speak with patient  Wt Readings from Last 10 Encounters:  06/07/15 113 lb 12.8 oz (51.619 kg)  06/04/15 114 lb (51.71 kg)  06/03/15 120 lb (54.432 kg)  05/25/15 130 lb (58.968 kg)    BMI:  Body mass index is 18.38 kg/(m^2).   LOW Care Level  Romelle Starcher MS, [place], LDN [phone] Pager

## 2015-06-09 DIAGNOSIS — A523 Neurosyphilis, unspecified: Principal | ICD-10-CM

## 2015-06-09 LAB — URINE CULTURE
Culture: NO GROWTH
Special Requests: NORMAL

## 2015-06-09 MED ORDER — TRAZODONE HCL 50 MG PO TABS
150.0000 mg | ORAL_TABLET | Freq: Every day | ORAL | Status: DC
  Administered 2015-06-09 – 2015-06-12 (×4): 150 mg via ORAL
  Filled 2015-06-09 (×4): qty 1

## 2015-06-09 MED ORDER — IBUPROFEN 600 MG PO TABS
600.0000 mg | ORAL_TABLET | ORAL | Status: AC
  Administered 2015-06-09: 600 mg via ORAL
  Filled 2015-06-09: qty 1

## 2015-06-09 MED ORDER — PENICILLIN G BENZATHINE 1200000 UNIT/2ML IM SUSP: 2.4000 10*6.[IU] | Freq: Once | INTRAMUSCULAR | Status: DC
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
## 2015-06-09 NOTE — Progress Notes (Signed)
E note Reviewed labs and CSF.  She has a very abnormal CSF consistent with neurosyphilis, even if VDRL ends up being negative.  Rec that she have picc placed and receive 14 days of IV penicillin 24 MU/24 hours. Will also need IM PCN x 1 at end of course. If she needs to be discharged I can see in follow up in 2-3 weeks.  I will not return until 7/ 28 and can see then if still in hospital

## 2015-06-09 NOTE — Progress Notes (Signed)
Notified MD of pt persistent severe headache s/p LP 7/23. Ibuprofen x 1 ordered. Will continue to monitor.

## 2015-06-09 NOTE — Progress Notes (Signed)
[facility] - [place] at [facility]   PATIENT NAME: Autumn Pruitt    MR#:  [id number]  DATE OF BIRTH:  May 24, 1951  SUBJECTIVE:  Very calm and pleasant. Sitter+ No complaints  REVIEW OF SYSTEMS:   Review of Systems  Constitutional: Negative for fever, chills and weight loss.  HENT: Negative for ear discharge, ear pain and nosebleeds.   Eyes: Negative for blurred vision, pain and discharge.  Respiratory: Negative for sputum production, shortness of breath, wheezing and stridor.   Cardiovascular: Negative for chest pain, palpitations, orthopnea and PND.  Gastrointestinal: Negative for nausea, vomiting, abdominal pain and diarrhea.  Genitourinary: Negative for urgency and frequency.  Musculoskeletal: Negative for back pain and joint pain.  Neurological: Negative for sensory change, speech change, focal weakness and weakness.  Psychiatric/Behavioral: Negative for depression and hallucinations. The patient is not nervous/anxious.   All other systems reviewed and are negative.  Tolerating Diet:yes  DRUG ALLERGIES:  No Known Allergies  VITALS:  Blood pressure 104/45, pulse 69, temperature 98 F (36.7 C), temperature source Oral, resp. rate 16, height  (1.676 m), weight 51.619 kg (113 lb 12.8 oz), SpO2 100 %.  PHYSICAL EXAMINATION:   Physical Exam  GENERAL:  64 y.o.-year-old patient lying in the bed with no acute distress.  EYES: Pupils equal, round, reactive to light and accommodation. No scleral icterus. Extraocular muscles intact.  HEENT: Head atraumatic, normocephalic. Oropharynx and nasopharynx clear.  NECK:  Supple, no jugular venous distention. No thyroid enlargement, no tenderness.  LUNGS: Normal breath sounds bilaterally, no wheezing, rales, rhonchi. No use of accessory muscles of respiration.  CARDIOVASCULAR: S1, S2 normal. No murmurs, rubs, or gallops.  ABDOMEN: Soft, nontender, nondistended. Bowel sounds present. No organomegaly or mass.   EXTREMITIES: No cyanosis, clubbing or edema b/l.    NEUROLOGIC: Cranial nerves II through XII are intact. No focal Motor or sensory deficits b/l.   PSYCHIATRIC: The patient is alert and oriented x 2 SKIN: No obvious rash, lesion, or ulcer.    LABORATORY PANEL:   CBC  Recent Labs Lab 06/07/15 2148  WBC 8.4  HGB 10.8*  HCT 32.5*  PLT 241    Chemistries   Recent Labs Lab 06/07/15 2148  NA 138  K 4.1  CL 104  CO2 27  GLUCOSE 142*  BUN 12  CREATININE 0.72  CALCIUM 9.5  AST 20  ALT 15  ALKPHOS 50  BILITOT 0.5    Cardiac Enzymes No results for input(s): TROPONINI in the last 168 hours.  RADIOLOGY:  Dg Fluoro Guide Lumbar Puncture  06/08/2015   CLINICAL DATA:  Altered mental status, confusion, psychosis, neuro syphilis  EXAM: DIAGNOSTIC LUMBAR PUNCTURE UNDER FLUOROSCOPIC GUIDANCE  FLUOROSCOPY TIME:  If the device does not provide the exposure index:  Fluoroscopy Time (in minutes and seconds):  24 seconds  Number of Acquired Images:  1.0  PROCEDURE: Informed consent was obtained from the patient prior to the procedure, including potential complications of headache, allergy, and pain. With the patient prone, the lower back was prepped with Betadine. 1% Lidocaine was used for local anesthesia. Lumbar puncture was performed at the L5-S1 level using a 20 gauge needle with return of blood-tinged CSF with an opening pressure of 10 cm water. 15 Ml of CSF were obtained for laboratory studies. The patient tolerated the procedure well and there were no apparent complications.  IMPRESSION: Successful fluoroscopic L5-S1 lumbar puncture. Blood-tinged CSF obtained which partially cleared while obtaining the 4 samples compatible with a traumatic  tap.  Normal opening pressure   Electronically Signed   By: Judie Petit.  Shick M.D.   On: 06/08/2015 12:56     ASSESSMENT AND PLAN:  64 y.o. female with a known history of CVA, mild psychosis and UTI. The patient was admitted for psychosis 2 days ago and  has been treated with Risperdal and Remeron for psychosis symptoms and depression.  1. 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 CSF VDRL pending -CSF protien elevated at 688 -pt may need PICC line for IV PCN at home  2 History of CVA -po asa -no neurodeficits  Management plans discussed with the patient, family and they are in agreement.  CODE STATUS: full  DVT Prophylaxis: hepairn  TOTAL TIME TAKING CARE OF THIS PATIENT: 35 minutes.  >50% time spent on counselling and coordination of care pt and dter Autumn Pruitt  POSSIBLE D/C IN 1-2 DAYS, DEPENDING ON CLINICAL CONDITION.   Damarea Merkel M.D on 06/09/2015 at 10:40 AM  Between 7am to 6pm - Pager - [phone]  After 6pm go to [email or web address] - password [email or web address] [facility]  [facility] [place] [facility]  Office  [phone]  CC: Primary care physician; No PCP Per Patient

## 2015-06-10 LAB — VDRL, CSF: VDRL Quant, CSF: NONREACTIVE

## 2015-06-10 MED ORDER — SENNOSIDES-DOCUSATE SODIUM 8.6-50 MG PO TABS
1.0000 | ORAL_TABLET | Freq: Two times a day (BID) | ORAL | Status: DC
  Administered 2015-06-10 – 2015-06-13 (×7): 1 via ORAL
  Filled 2015-06-10 (×8): qty 1

## 2015-06-10 NOTE — Care Management (Signed)
Spoke with Dr Enedina Finner concerning discharge plan. Patient remains IVC with no payer. Will need 14 days abx. PICC line. Needs SNF on LOG but not if IVC.  CSW following ,NCM will continue to follow.

## 2015-06-10 NOTE — Progress Notes (Signed)
[facility] - [place] at [facility]   PATIENT NAME: Autumn Pruitt    MR#:  [id number]  DATE OF BIRTH:  Sep 28, 1951  SUBJECTIVE:  Very calm and pleasant. Sitter+ No complaints  REVIEW OF SYSTEMS:   Review of Systems  Constitutional: Negative for fever, chills and weight loss.  HENT: Negative for ear discharge, ear pain and nosebleeds.   Eyes: Negative for blurred vision, pain and discharge.  Respiratory: Negative for sputum production, shortness of breath, wheezing and stridor.   Cardiovascular: Negative for chest pain, palpitations, orthopnea and PND.  Gastrointestinal: Negative for nausea, vomiting, abdominal pain and diarrhea.  Genitourinary: Negative for urgency and frequency.  Musculoskeletal: Negative for back pain and joint pain.  Neurological: Negative for sensory change, speech change, focal weakness and weakness.  Psychiatric/Behavioral: Negative for depression and hallucinations. The patient is not nervous/anxious.   All other systems reviewed and are negative.  Tolerating Diet:yes  DRUG ALLERGIES:  No Known Allergies  VITALS:  Blood pressure 92/40, pulse 74, temperature 97.5 F (36.4 C), temperature source Oral, resp. rate 18, height 5\' 6"  (1.676 m), weight 51.619 kg (113 lb 12.8 oz), SpO2 100 %.  PHYSICAL EXAMINATION:   Physical Exam  GENERAL:  64 y.o.-year-old patient lying in the bed with no acute distress.  EYES: Pupils equal, round, reactive to light and accommodation. No scleral icterus. Extraocular muscles intact.  HEENT: Head atraumatic, normocephalic. Oropharynx and nasopharynx clear.  NECK:  Supple, no jugular venous distention. No thyroid enlargement, no tenderness.  LUNGS: Normal breath sounds bilaterally, no wheezing, rales, rhonchi. No use of accessory muscles of respiration.  CARDIOVASCULAR: S1, S2 normal. No murmurs, rubs, or gallops.  ABDOMEN: Soft, nontender, nondistended. Bowel sounds present. No organomegaly or mass.   EXTREMITIES: No cyanosis, clubbing or edema b/l.    NEUROLOGIC: Cranial nerves II through XII are intact. No focal Motor or sensory deficits b/l.   PSYCHIATRIC: The patient is alert and oriented x 2 SKIN: No obvious rash, lesion, or ulcer.    LABORATORY PANEL:   CBC  Recent Labs Lab 06/07/15 2148  WBC 8.4  HGB 10.8*  HCT 32.5*  PLT 241    Chemistries   Recent Labs Lab 06/07/15 2148  NA 138  K 4.1  CL 104  CO2 27  GLUCOSE 142*  BUN 12  CREATININE 0.72  CALCIUM 9.5  AST 20  ALT 15  ALKPHOS 50  BILITOT 0.5    Cardiac Enzymes No results for input(s): TROPONINI in the last 168 hours.  RADIOLOGY:  No results found.   ASSESSMENT AND PLAN:  64 y.o. female with a known history of CVA, mild psychosis and UTI. The patient was admitted for psychosis 2 days ago and has been treated with Risperdal and Remeron for psychosis symptoms and depression.  1. 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  -CSF VDRL pending -CSF protien elevated at 688 -pt may need PICC line for IV PCN   2 History of CVA -po asa -no neurodeficits  3.Bizzare behavior with acute psychosis -seen by Dr clapacs per him cont IVC, pt not capable of making decision  Management plans discussed with the patient, family and they are in agreement.  CODE STATUS: full  DVT Prophylaxis: hepairn  TOTAL TIME TAKING CARE OF THIS PATIENT: 35 minutes.  >50% time spent on counselling and coordination of care pt and dter  Katina  POSSIBLE D/C IN 1-2 DAYS, DEPENDING ON CLINICAL CONDITION.   Hommer Cunliffe M.D on 06/10/2015 at 3:28 PM  Between 7am to 6pm - Pager - [phone]  After 6pm go to [email or web address] - password [email or web address] [facility]  [facility]  Office  [phone]  CC: Primary care physician; No PCP Per Patient

## 2015-06-10 NOTE — Progress Notes (Signed)
No information faxed to next level of care Patient discharge to medical floor

## 2015-06-11 LAB — CSF CULTURE: Culture: NO GROWTH

## 2015-06-11 LAB — PLATELET COUNT: PLATELETS: 282 10*3/uL (ref 150–440)

## 2015-06-11 LAB — CSF CULTURE W GRAM STAIN

## 2015-06-11 NOTE — Progress Notes (Signed)
Spoke with CSW about d/c planning Pt's dter is able to take care of pt at home with other family memebrs. She is willing to learn to give IV abx at home with help of [facility] Spoke with Pharmacy and penicillin G can be given 4 million units IV q 4 hourly.  D/w ID and see if this is ok with them Spoke with dr clapacs and he is recommending placement. Pt does not have a payer source atpresent. She will have some behavioral issus on and off which may last forever.  IVC will be d/ced once d/c planning s placed  Pt will need PICC line for IV abxs at d/c. Order it once final d/c plans have been confirmed with CSW, and ID

## 2015-06-11 NOTE — Progress Notes (Signed)
[facility] - [place] at [facility]   PATIENT NAME: Autumn Pruitt    MR#:  [id number]  DATE OF BIRTH:  1951-06-19  SUBJECTIVE:  Very calm and pleasant. Sitter+ No complaints. Wants to go for her hair appt  REVIEW OF SYSTEMS:   Review of Systems  Constitutional: Negative for fever, chills and weight loss.  HENT: Negative for ear discharge, ear pain and nosebleeds.   Eyes: Negative for blurred vision, pain and discharge.  Respiratory: Negative for sputum production, shortness of breath, wheezing and stridor.   Cardiovascular: Negative for chest pain, palpitations, orthopnea and PND.  Gastrointestinal: Negative for nausea, vomiting, abdominal pain and diarrhea.  Genitourinary: Negative for urgency and frequency.  Musculoskeletal: Negative for back pain and joint pain.  Neurological: Negative for sensory change, speech change, focal weakness and weakness.  Psychiatric/Behavioral: Negative for depression and hallucinations. The patient is not nervous/anxious.   All other systems reviewed and are negative.  Tolerating Diet:yes  DRUG ALLERGIES:  No Known Allergies  VITALS:  Blood pressure 93/66, pulse 87, temperature 98 F (36.7 C), temperature source Oral, resp. rate 18, height  (1.676 m), weight 51.619 kg (113 lb 12.8 oz), SpO2 100 %.  PHYSICAL EXAMINATION:   Physical Exam  GENERAL:  64 y.o.-year-old patient lying in the bed with no acute distress.  EYES: Pupils equal, round, reactive to light and accommodation. No scleral icterus. Extraocular muscles intact.  HEENT: Head atraumatic, normocephalic. Oropharynx and nasopharynx clear.  NECK:  Supple, no jugular venous distention. No thyroid enlargement, no tenderness.  LUNGS: Normal breath sounds bilaterally, no wheezing, rales, rhonchi. No use of accessory muscles of respiration.  CARDIOVASCULAR: S1, S2 normal. No murmurs, rubs, or gallops.  ABDOMEN: Soft, nontender, nondistended. Bowel sounds  present. No organomegaly or mass.  EXTREMITIES: No cyanosis, clubbing or edema b/l.    NEUROLOGIC: Cranial nerves II through XII are intact. No focal Motor or sensory deficits b/l.   PSYCHIATRIC: The patient is alert and oriented x 2 SKIN: No obvious rash, lesion, or ulcer.    LABORATORY PANEL:   CBC  Recent Labs Lab 06/07/15 2148 06/11/15 0349  WBC 8.4  --   HGB 10.8*  --   HCT 32.5*  --   PLT 241 282    Chemistries   Recent Labs Lab 06/07/15 2148  NA 138  K 4.1  CL 104  CO2 27  GLUCOSE 142*  BUN 12  CREATININE 0.72  CALCIUM 9.5  AST 20  ALT 15  ALKPHOS 50  BILITOT 0.5    Cardiac Enzymes No results for input(s): TROPONINI in the last 168 hours.  RADIOLOGY:  No results found.   ASSESSMENT AND PLAN:  64 y.o. female with a known history of CVA, mild psychosis and UTI. The patient was admitted for psychosis 2 days ago and has been treated with Risperdal and Remeron for psychosis symptoms and depression.  1. 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  -CSF VDRL non reactive -CSF protien elevated at 688 -pt may need PICC line for IV PCN   2 History of CVA -po asa -no neurodeficits  3.Bizzare behavior with acute psychosis -seen by Dr clapacs per him cont IVC, pt not capable of making decision  Management plans discussed with the patient, family and they are in agreement.  CODE STATUS: full  DVT Prophylaxis: hepairn  TOTAL TIME  TAKING CARE OF THIS PATIENT: 35 minutes.  >50% time spent on counselling and coordination of care pt and dter Laurelyn Sickle  POSSIBLE D/C IN 1-2 DAYS, DEPENDING ON CLINICAL CONDITION.   Kaleah Hagemeister M.D on 06/11/2015 at 11:29 AM  Between 7am to 6pm - Pager - [phone]  After 6pm go to [email or web address] - password [email or web address] [facility]  [place] [facility]  Office   [phone]  CC: Primary care physician; No PCP Per Patient

## 2015-06-11 NOTE — Clinical Social Work Note (Signed)
Clinical Social Work Assessment  Patient Details  Name: Autumn Pruitt MRN: [id number] Date of Birth: 1951/05/18  Date of referral:  06/11/15               Reason for consult:  Facility Placement                Permission sought to share information with:  Family Supports Permission granted to share information::  Yes, Verbal Permission Granted  Name::        Agency::     Relationship::     Contact Information:     Housing/Transportation Living arrangements for the past 2 months:   (home) Source of Information:  Patient, Adult Children Patient Interpreter Needed:  None Criminal Activity/Legal Involvement Pertinent to Current Situation/Hospitalization:  No - Comment as needed Significant Relationships:  Adult Children Lives with:  Self Do you feel safe going back to the place where you live?  Yes Need for family participation in patient care:  Yes (Comment)  Care giving concerns:  Patient lives alone   Social Worker assessment / plan:  CSW met with patient who was alert and oriented X4. Patient tended to go off on tangents while speaking to CSW but would answer questions appropriately. Patient was able to describe what the physicians had discussed with her regarding her illness and that she would need iv antibiotics at discharge. Patient was amenable to whatever plan was developed and was open to going to a facility if need be for her iv antibiotics. Patient still under IVC at this time. Patient is upset with her son in law thinking that he is trying to take her meds and "cards" from her but patient is not unhappy with his wife, patient's daughter. She trusts her daughter and is not concerned that she is out to get her, she is only concerned that the son in law is out to get her.   CSW spoke with patient's daughter, Autumn Pruitt, whom patient stated was the contact for her. Autumn Pruitt stated that patient has never behaved like she did on day of admission to hospital and that she typically would be  confused at times since her stroke but had never been as volatile as Saturday. Autumn Pruitt explained that patient used to love her son in law but as of recent has been accusing him of being a addicted to crack/cocaine and stealing her food stamps and other belongings. Autumn Pruitt stated that she is not sure what precipitated those thoughts. CSW explained that patient would require iv abx at discharge and that we could possibly send patient to a facility on a letter of guarantee and that she would have to apply for medicaid on behalf of her mother. Autumn Pruitt stated that she would prefer to take her mother home and that she and her daughter have already discussed that her daughter would stay with her during the day and that Autumn Pruitt would stay with her at night time so she would not be alone. Autumn Pruitt stated she would be willing to learn how to give patient her iv abx. CSW has made RN CM aware.  Employment status:  Disabled (Comment on whether or not currently receiving Disability) Insurance information:  Self Pay (Medicaid Pending) PT Recommendations:  Not assessed at this time Information / Referral to community resources:     Patient/Family's Response to care:  Patient was very pleasant as was patient's daughter.  Patient/Family's Understanding of and Emotional Response to Diagnosis, Current Treatment, and Prognosis:  Patient and daughter expressed appreciation  for CSW assistance.   Emotional Assessment Appearance:  Appears older than stated age, Disheveled Attitude/Demeanor/Rapport:   (cooperative and pleasant) Affect (typically observed):  Accepting, Pleasant Orientation:  Oriented to Self, Oriented to Place, Oriented to  Time, Oriented to Situation Alcohol / Substance use:  Not Applicable Psych involvement (Current and /or in the community):  Yes (Comment)  Discharge Needs  Concerns to be addressed:  Care Coordination Readmission within the last 30 days:  No Current discharge risk:   (possible  neurosyphillis) Barriers to Discharge:  No Barriers Identified   Shela Leff, LCSW 06/11/2015, 1:44 PM

## 2015-06-11 NOTE — Care Management (Signed)
Contacted Autumn Pruitt at [facility] to arrange charity care for home health nursing. Patient will need PCP for orders. Will check and see if medicaid application has been submitted.

## 2015-06-11 NOTE — Consult Note (Signed)
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 Referring Physician:  Allena Katz Patient Identification: Autumn Pruitt MRN:  [id number] Principal Diagnosis: Neurosyphilis Diagnosis:   Patient Active Problem List   Diagnosis Date Noted  . 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 UTI (urinary tract infection) [N39.0] 06/05/2015  . 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 Stroke [I63.9] 06/03/2015    Total Time spent with patient: 1 hour  Subjective:   Autumn Pruitt is a 64 y.o. female patient admitted with I saw the patient in the emergency room. She was admitted under petition because her family found her to be getting increasingly labile and bizarre. She had been threatening people with hammers, standing out in front of her house screaming, becoming increasingly paranoid. Today the patient has no specific complaints.Marland Kitchen  HPI:  As noted above the patient was exhibiting multiple bizarre behaviors indicative of paranoid psychosis. She had poor insight into this. Family reported that it had been getting worse for the last few months and thought that it had started to be more evident after she had a stroke. Patient did not have a very profound past psychiatric history before that.  No history of suicidal attempts. She had been aggressive to her son-in-law but fortunately had not managed to injure him. Had not been on previous psychiatric medicines I don't think.  Social history: Patient had previously lived independently but had been either living with her daughter or closely supervised by her daughter and son-in-law since her recent stroke.  Medical history: History of a stroke recently which at the time did not appear to be causing obvious psychiatry problems history of COPD. What appears to be recent weight loss.  By the good fortune in good thinking of the  treatment team she was tested for syphilis and appeared to come back positive. As the evidence piles up it seems to be more likely that she could actually have neurosyphilis. Diagnosis probably not yet 100% certain. HPI Elements:   Quality:  Psychosis and agitation. Severity:  Severe enough to make it impossible to live outside the hospital. Timing:  Getting worse over months. Duration:  Ongoing. Context:  Recent stroke.  Past Medical History:  Past Medical History  Diagnosis Date  . CVA (cerebral infarction)   . Stroke     Past Surgical History  Procedure Laterality Date  . Tubal ligation     Family History:  Family History  Problem Relation Age of Onset  . Cancer Father    Social History:  History  Alcohol Use No     History  Drug Use No    History   Social History  . Marital Status: Widowed    Spouse Name: N/A  . Number of Children: N/A  . Years of Education: N/A   Social History Main Topics  . Smoking status: Never Smoker   . Smokeless tobacco: Never Used  . Alcohol Use: No  . Drug Use: No  . Sexual Activity: Not Currently   Other Topics Concern  . None   Social History Narrative   Additional Social History:                          Allergies:  No Known Allergies  Labs:  Results for orders placed or performed during the hospital encounter of 06/07/15 (from the past 48 hour(s))  Platelet count  Status: None   Collection Time: 06/11/15  3:49 AM  Result Value Ref Range   Platelets 282 150 - 440 K/uL    Vitals: Blood pressure 116/43, pulse 86, temperature 98.2 F (36.8 C), temperature source Oral, resp. rate 18, height 5\' 6"  (1.676 m), weight 51.619 kg (113 lb 12.8 oz), SpO2 100 %.  Risk to Self: Is patient at risk for suicide?: No Risk to Others:   Prior Inpatient Therapy:   Prior Outpatient Therapy:    Current Facility-Administered Medications  Medication Dose Route Frequency Provider Last Rate Last Dose  . acetaminophen (TYLENOL)  tablet 650 mg  650 mg Oral Q6H PRN Shaune Pollack, MD   650 mg at 06/11/15 2013   Or  . acetaminophen (TYLENOL) suppository 650 mg  650 mg Rectal Q6H PRN Shaune Pollack, MD      . albuterol (PROVENTIL) (2.5 MG/3ML) 0.083% nebulizer solution 2.5 mg  2.5 mg Nebulization Q2H PRN Shaune Pollack, MD      . aspirin chewable tablet 81 mg  81 mg Oral Daily Enedina Finner, MD   81 mg at 06/11/15 1010  . feeding supplement (ENSURE ENLIVE) (ENSURE ENLIVE) liquid 237 mL  237 mL Oral BID BM Enedina Finner, MD   237 mL at 06/11/15 1000  . heparin injection 5,000 Units  5,000 Units Subcutaneous 3 times per day Shaune Pollack, MD   5,000 Units at 06/11/15 1432  . LORazepam (ATIVAN) injection 2 mg  2 mg Intravenous Once Enedina Finner, MD   2 mg at 06/08/15 1130  . ondansetron (ZOFRAN) tablet 4 mg  4 mg Oral Q6H PRN Shaune Pollack, MD       Or  . ondansetron [facility]) injection 4 mg  4 mg Intravenous Q6H PRN Shaune Pollack, MD      . penicillin G potassium 24 Million Units in dextrose 5 % 250 mL IVPB  24 Million Units Intravenous Q24H Enedina Finner, MD   24 Million Units at 06/11/15 1824  . risperiDONE (RISPERDAL) tablet 0.5 mg  0.5 mg Oral BID Shaune Pollack, MD   0.5 mg at 06/11/15 1011  . senna-docusate (Senokot-S) tablet 1 tablet  1 tablet Oral BID Enedina Finner, MD   1 tablet at 06/11/15 1011  . traZODone (DESYREL) tablet 150 mg  150 mg Oral QHS Audery Amel, MD   150 mg at 06/10/15 2114    Musculoskeletal: Strength & Muscle Tone: within normal limits Gait & Station: unsteady Patient leans: N/A  Psychiatric Specialty Exam: Physical Exam  Constitutional: She appears well-developed and well-nourished.  HENT:  Head: Normocephalic and atraumatic.  Eyes: Conjunctivae are normal. Pupils are equal, round, and reactive to light.  Neck: Normal range of motion.  Cardiovascular: Normal heart sounds.   Respiratory: Effort normal.  GI: Soft.  Musculoskeletal: Normal range of motion.  Neurological: She is alert.  Skin: Skin is warm and dry.  Psychiatric: Her  affect is labile. Her speech is delayed and slurred. She is agitated. Thought content is delusional. Cognition and memory are impaired. She expresses impulsivity. She exhibits abnormal recent memory and abnormal remote memory.  Patient currently presents as being calm and cooperative. Made good eye contact. Her speech tends to ramble but was not overly pressured. She does have a slight slurring to her speech probably related to her history of stroke or her current condition. Her agitation currently is not aggressive. She is slightly demanding but in a rather jovial manner. She has protein and delusions about men who are going  to marry her. Poor insight and judgment related to poor cognitive capacity    Review of Systems  Constitutional: Negative.   HENT: Negative.   Eyes: Negative.   Respiratory: Negative.   Cardiovascular: Negative.   Gastrointestinal: Negative.   Musculoskeletal: Negative.   Skin: Negative.   Neurological: Negative.   Psychiatric/Behavioral: Positive for memory loss. Negative for depression, suicidal ideas, hallucinations and substance abuse. The patient has insomnia. The patient is not nervous/anxious.     Blood pressure 116/43, pulse 86, temperature 98.2 F (36.8 C), temperature source Oral, resp. rate 18, height  (1.676 m), weight 51.619 kg (113 lb 12.8 oz), SpO2 100 %.Body mass index is 18.38 kg/(m^2).  General Appearance: Fairly Groomed  Patent attorney::  Good  Speech:  Garbled, Slow and Slurred  Volume:  Normal  Mood:  Euphoric  Affect:  Full Range  Thought Process:  Loose and Tangential  Orientation:  Full (Time, Place, and Person)  Thought Content:  Delusions and Paranoid Ideation  Suicidal Thoughts:  No  Homicidal Thoughts:  No  Memory:  Immediate;   Good Recent;   Poor Remote;   Fair  Judgement:  Impaired  Insight:  Lacking  Psychomotor Activity:  Decreased  Concentration:  Poor  Recall:  Poor  Fund of Knowledge:Fair  Language: Fair  Akathisia:  No   Handed:  Right  AIMS (if indicated):     Assets:  Communication Skills Desire for Improvement Intimacy Social Support  ADL's:  Intact  Cognition: Impaired,  Mild  Sleep:      Medical Decision Making: New problem, with additional work up planned, Review of Psycho-Social Stressors (1), Review or order clinical lab tests (1), Review and summation of old records (2) and Review of Medication Regimen & Side Effects (2)  Treatment Plan Summary: Daily contact with patient to assess and evaluate symptoms and progress in treatment, Medication management and Plan PAtient continues to be confused and demented. No agitation or threats. Cooperating with meds. Long term prognosis unclear but likely to remain impaired. Continue medication. Plan to live with daughter after discharge.   Plan:  Supportive therapy provided about ongoing stressors. Patient continues to be under paperwork for involuntary commitment. While she is probably not any acute threat to herself she remains confused and I don't think she probably has the capacity to sign herself in voluntarily so we will need to keep her under involuntary commitment paperwork. If the policy requires her to continue to have a sitter we will probably need to do that at least for the time being. If someone were to get power of attorney at some point that could change things. Disposition: And in use psychiatric medicine and follow-up  Mordecai Rasmussen 06/11/2015 10:09 PM

## 2015-06-12 ENCOUNTER — Inpatient Hospital Stay: Payer: Self-pay

## 2015-06-12 MED ORDER — SODIUM CHLORIDE 0.9 % IJ SOLN: 10.0000 mL | INTRAMUSCULAR | Status: DC | PRN

## 2015-06-12 MED ORDER — SODIUM CHLORIDE 0.9 % IJ SOLN
10.0000 mL | Freq: Two times a day (BID) | INTRAMUSCULAR | Status: DC
  Administered 2015-06-12 – 2015-06-13 (×2): 10 mL

## 2015-06-12 NOTE — Clinical Social Work Note (Signed)
RN CM contacted patient's daughter, Alwyn Ren, this morning to finalize arrangements for home. Alwyn Ren informed RN CM that she had changed her mind and now decided no one would be with patient during the day and would now want placement for patient. RN CM relayed that if there were further questions, that Alwyn Ren wanted CSW to contact her sister: Lurline Idol: [phone]. CSW spoke with [facility] via phone this morning and stated that it was not quite as simple as finding a placement for patient and CSW explained that patient would have to be approved for a Letter of Guarrantee first and then a facility would have to accept patient and that this could be in a different county. Daneeka verbalized understanding. CSW asked if she had applied for medicaid for patient yet and she stated that she met with Velna Hatchet from [employer] this morning and applied and the RN CM was able to confirm with CSW that he spoke with Velna Hatchet and she mailed the application off this morning. Bedsearch has been initiated at facilities that will accept an LOG. Awaiting to hear back. Also waiting to hear back from Surveyor, quantity of CSW to determine if an LOG would be approved. Shela Leff MSW,LCSW [phone]

## 2015-06-12 NOTE — Progress Notes (Signed)
[facility] [place] [facility]   PATIENT NAME: Autumn Pruitt    MR#:  [id number]  DATE OF BIRTH:  [birth year]  SUBJECTIVE:  Very calm and pleasant. Sitter+. Wants to go for her hair fixed, crying. Sister at bedside. REVIEW OF SYSTEMS:   Review of Systems  Constitutional: Negative for fever, chills and weight loss.  HENT: Negative for ear discharge, ear pain and nosebleeds.   Eyes: Negative for blurred vision, pain and discharge.  Respiratory: Negative for sputum production, shortness of breath, wheezing and stridor.   Cardiovascular: Negative for chest pain, palpitations, orthopnea and PND.  Gastrointestinal: Negative for nausea, vomiting, abdominal pain and diarrhea.  Genitourinary: Negative for urgency and frequency.  Musculoskeletal: Negative for back pain and joint pain.  Neurological: Negative for sensory change, speech change, focal weakness and weakness.  Psychiatric/Behavioral: Negative for depression and hallucinations. The patient is not nervous/anxious.   All other systems reviewed and are negative.  Tolerating Diet:yes  DRUG ALLERGIES:  No Known Allergies  VITALS:  Blood pressure 104/53, pulse 82, temperature 97.4 F (36.3 C), temperature source Oral, resp. rate 17, height 5\' 6"  (1.676 m), weight 51.619 kg (113 lb 12.8 oz), SpO2 100 %.  PHYSICAL EXAMINATION:   Physical Exam  GENERAL:  64 y.o.-year-old patient lying in the bed with no acute distress.  EYES: Pupils equal, round, reactive to light and accommodation. No scleral icterus. Extraocular muscles intact.  HEENT: Head atraumatic, normocephalic. Oropharynx and nasopharynx clear.  NECK:  Supple, no jugular venous distention. No thyroid enlargement, no tenderness.  LUNGS: Normal breath sounds bilaterally, no wheezing, rales, rhonchi. No use of accessory muscles of respiration.  CARDIOVASCULAR: S1, S2 normal. No murmurs, rubs, or gallops.  ABDOMEN: Soft, nontender, nondistended.  Bowel sounds present. No organomegaly or mass.  EXTREMITIES: No cyanosis, clubbing or edema b/l.    NEUROLOGIC: Cranial nerves II through XII are intact. No focal Motor or sensory deficits b/l.   PSYCHIATRIC: The patient is alert and oriented x 2 SKIN: No obvious rash, lesion, or ulcer.  LABORATORY PANEL:   CBC  Recent Labs Lab 06/07/15 2148 06/11/15 0349  WBC 8.4  --   HGB 10.8*  --   HCT 32.5*  --   PLT 241 282    Chemistries   Recent Labs Lab 06/07/15 2148  NA 138  K 4.1  CL 104  CO2 27  GLUCOSE 142*  BUN 12  CREATININE 0.72  CALCIUM 9.5  AST 20  ALT 15  ALKPHOS 50  BILITOT 0.5   ASSESSMENT AND PLAN:  64 y.o. female with a known history of CVA, mild psychosis and UTI. The patient was admitted for psychosis 2 days ago and has been treated with Risperdal and Remeron for psychosis symptoms and depression.  1. 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  -CSF VDRL non reactive -CSF protien elevated at 688 - will get PICC line for IV PCN   2 History of CVA -po asa -no neurodeficits  3.Bizzare behavior with acute psychosis -seen by Dr clapacs per him cont IVC, pt not capable of making decision  Management plans discussed with the patient, family and they are in agreement.  CODE STATUS: full  DVT Prophylaxis: hepairn  TOTAL TIME TAKING CARE OF THIS PATIENT: 35 minutes.   >50% time spent on counselling and coordination of care pt and dter at  bedside.   CM reports - family has changed their mind now and wanting STR/SNF  POSSIBLE D/C IN 2-3 DAYS, DEPENDING ON CLINICAL CONDITION and placement   [facility], [name] M.D on 06/12/2015 at 11:59 AM  Between 7am to 6pm - Pager - [phone]  After 6pm go to [email or web address] - password [email or web address] [facility]  [place]  Hospitalists  Office  [phone]  CC: Primary care  physician; No PCP Per Patient

## 2015-06-12 NOTE — Care Management (Signed)
Spoke with daughter of the patient Autumn Pruitt who stated that she had discussed patient discharge plan with her sister [name] and due to their work schedule that they felt their mother would be better off going to inpatient Rehab.  She stated that her sister had spoke to someone here at the hospital around 1030 this morning concerning this. Could find no notes on this. Informed CSW York Spaniel of change in plan. Patient will need LOG since uninsured. Daughter stated that they were applying for Medicaid.

## 2015-06-13 MED ORDER — PENICILLIN G BENZATHINE 1200000 UNIT/2ML IM SUSP: 1.2000 10*6.[IU] | Freq: Once | INTRAMUSCULAR | Status: DC

## 2015-06-13 MED ORDER — ENSURE ENLIVE PO LIQD: 237.0000 mL | Freq: Two times a day (BID) | ORAL | Status: DC

## 2015-06-13 MED ORDER — PENICILLIN G POTASSIUM 5000000 UNITS IJ SOLR: 4.0000 10*6.[IU] | INTRAVENOUS | Status: AC

## 2015-06-13 NOTE — Plan of Care (Addendum)
Problem: Phase III Progression Outcomes Goal: Pain controlled on oral analgesia Pt is alert with confusion at times, provides delayed responses, c/o neck pain improved with tylenol. Denies n/v, up to bsc with one assist,   Problem: Discharge Progression Outcomes Goal: Activity appropriate for discharge plan Outcome: Adequate for Discharge Pt is alert and oriented with confusion at times, up to bathroom with stand by assist, penicillin drip infusing, sitter at bedside, good appetite, c/o neck pain improved with tylenol, angered over d/c to [facility] and reports that nursing staff are "liars", pt is convinced that she is being d/c to home. Pt is a full code and will be d/c to [place] center via EMS. Afebrile throughout shift, blood pressure hypotensive, pt is asymptomatic, MD notified. Uneventful shift. Report given to Clydie Braun at 15:30. Attempted to give report to [facility] after multiple calls and holding 15 + minutes and no nurse became available to give report to. Penicillin drip currently infusing through peripheal line.

## 2015-06-13 NOTE — Progress Notes (Signed)
Report called to [facility] at 1600, report given to [facility] RN. EMS called immediately after report given to [facility] and this writer was told that they will not be able to pick patient up at this time and as soon as they have a truck they will pick patient up and as of right now they could not given me a time.

## 2015-06-13 NOTE — Care Management (Signed)
Received callback from [facility] form [facility] concerning patient. Shawn Stall at [facility] who would like Spinal tap diagnosis notes sent. Faxed notes to her at [phone]. Awaiting more information. CSW continues to work on LOG placement.

## 2015-06-13 NOTE — Discharge Summary (Signed)
[facility] [place] [facility]   PATIENT NAME: Autumn Pruitt    MR#:  [id number]  DATE OF BIRTH:  [birth year]  DATE OF ADMISSION:  06/07/2015 ADMITTING PHYSICIAN: Ramonita Lab, MD  DATE OF DISCHARGE: 06/13/2015  PRIMARY CARE PHYSICIAN: No PCP Per Patient    ADMISSION DIAGNOSIS:  new neuro syphilis DISCHARGE DIAGNOSIS:  Principal Problem:   Neurosyphilis Active Problems:   Psychosis, organic  SECONDARY DIAGNOSIS:   Past Medical History  Diagnosis Date  . CVA (cerebral infarction)   . Stroke    HOSPITAL COURSE:  64 y.o. female with a known history of CVA, mild psychosis and UTI. The patient was admitted for psychosis initially and was treated with Risperdal and Remeron for psychosis symptoms and depression. Subsequently was found have to + RPR for which she was transferred to medical service. Please see Dr Nicky Pugh dictated H & P for further details. ID consultation was obtained with Dr Sampson Goon who recommended total 14 days course of PCN iv F/BY 1 IM injection of PCN in each buttock.  Patient was also followed by psychiatry throughout the course in the hospital.  After evaluation by physical therapy and the consultants, decision was made to discharge her to nursing facility for long-term IV antibiotics.  Family is in agreement.  She is being discharged to [facility] [place] in stable condition. DISCHARGE CONDITIONS:  Stable CONSULTS OBTAINED:  Treatment Team:  Clydie Braun, MD Audery Amel, MD Delfino Lovett, MD DRUG ALLERGIES:  No Known Allergies DISCHARGE MEDICATIONS:   Current Discharge Medication List    START taking these medications   Details  feeding supplement, ENSURE ENLIVE, (ENSURE ENLIVE) LIQD Take 237 mLs by mouth 2 (two) times daily between meals. Qty: 237 mL, Refills: 12    penicillin g benzathine (BICILLIN LA) 1200000 UNIT/2ML SUSP injection Inject 2 mLs (1.2 Million Units total) into the muscle once. Give 1.2 MU in  each buttock after finishing 1 week course of Penicillin G Qty: 4 mL, Refills: 0    penicillin G potassium 4 Million Units in dextrose 5 % 250 mL Inject 4 Million Units into the vein every 4 (four) hours. Qty: 42 Dose, Refills: 0      CONTINUE these medications which have NOT CHANGED   Details  aspirin EC 81 MG tablet Take 1 tablet (81 mg total) by mouth daily. Qty: 30 tablet, Refills: 1    risperiDONE (RISPERDAL) 1 MG tablet Take 0.5 mg by mouth 2 (two) times daily.    traZODone (DESYREL) 50 MG tablet Take 1 tablet (50 mg total) by mouth at bedtime. Qty: 30 tablet, Refills: 0      STOP taking these medications     cephALEXin (KEFLEX) 500 MG capsule      potassium chloride (K-DUR) 10 MEQ tablet        DISCHARGE INSTRUCTIONS:   DIET:  Regular diet DISCHARGE CONDITION:  Stable ACTIVITY:  Activity as tolerated OXYGEN:  Home Oxygen: No.  Oxygen Delivery: room air DISCHARGE LOCATION:  nursing home   If you experience worsening of your admission symptoms, develop shortness of breath, life threatening emergency, suicidal or homicidal thoughts you must seek medical attention immediately by calling 911 or calling your MD immediately  if symptoms less severe.  You Must read complete instructions/literature along with all the possible adverse reactions/side effects for all the Medicines you take and that have been prescribed to you. Take any new Medicines after you have completely understood and accpet all  the possible adverse reactions/side effects.   Please note  You were cared for by a hospitalist during your hospital stay. If you have any questions about your discharge medications or the care you received while you were in the hospital after you are discharged, you can call the unit and asked to speak with the hospitalist on call if the hospitalist that took care of you is not available. Once you are discharged, your primary care physician will handle any further medical  issues. Please note that NO REFILLS for any discharge medications will be authorized once you are discharged, as it is imperative that you return to your primary care physician (or establish a relationship with a primary care physician if you do not have one) for your aftercare needs so that they can reassess your need for medications and monitor your lab values.   On the day of Discharge: VITAL SIGNS:  Blood pressure 96/47, pulse 75, temperature 98.1 F (36.7 C), temperature source Oral, resp. rate 16, height  (1.676 m), weight 49.896 kg (110 lb), SpO2 98 %. PHYSICAL EXAMINATION:  GENERAL:  64 y.o.-year-old patient lying in the bed with no acute distress.  EYES: Pupils equal, round, reactive to light and accommodation. No scleral icterus. Extraocular muscles intact.  HEENT: Head atraumatic, normocephalic. Oropharynx and nasopharynx clear.  NECK:  Supple, no jugular venous distention. No thyroid enlargement, no tenderness.  LUNGS: Normal breath sounds bilaterally, no wheezing, rales,rhonchi or crepitation. No use of accessory muscles of respiration.  CARDIOVASCULAR: S1, S2 normal. No murmurs, rubs, or gallops.  ABDOMEN: Soft, non-tender, non-distended. Bowel sounds present. No organomegaly or mass.  EXTREMITIES: No pedal edema, cyanosis, or clubbing.  NEUROLOGIC: Cranial nerves II through XII are intact. Muscle strength 5/5 in all extremities. Sensation intact. Gait not checked.  PSYCHIATRIC: The patient is alert and oriented x 3.  SKIN: No obvious rash, lesion, or ulcer.   Management plans discussed with the patient, family and they are in agreement.  CODE STATUS: Full code  TOTAL TIME TAKING CARE OF THIS PATIENT: 55 minutes.    [facility], Teo Moede M.D on 06/13/2015 at 2:34 PM  Between 7am to 6pm - Pager - [phone]  After 6pm go to [email or web address] - password [email or web address] [facility]  [place] Hospital[facility]  Office  [phone]  CC: Primary care physician; No PCP Per Patient  Audery Amel, MD Clydie Braun, MD

## 2015-06-13 NOTE — Progress Notes (Signed)
ID FU Pt more alert, knows date, where she is Rec  Finish PCN IV x 14 days then IM PCN 2.4 MU x 1 to complete 3 week course I can see in 3-4 weeks to follow RPR

## 2015-06-13 NOTE — Discharge Instructions (Signed)
Syphilis Syphilis is an infectious disease. It can cause serious complications if left untreated.  CAUSES  Syphilis is caused by a type of bacteria called Treponema pallidum. It is most commonly spread through sexual contact. Syphilis may also spread to a fetus through the blood of the mother.  SIGNS AND SYMPTOMS Symptoms vary depending on the stage of the disease. Some symptoms may disappear without treatment. However, this does not mean that the infection is gone. One form of syphilis (called latent syphilis) has no symptoms.  Primary Syphilis  Painless sores (chancres) in and around the genital organs and mouth.  Swollen lymph nodes near the sores. Secondary Syphilis  A rash or sores over any portion of the body, including the palms of the hands and soles of the feet.  Fever.  Headache.  Sore throat.  Swollen lymph nodes.  New sores in the mouth or on the genitals.  Feeling generally ill.  Having pain in the joints. Tertiary Syphilis The third stage of syphilis involves severe damage to different organs in the body, such as the brain, spinal cord, and heart. Signs and symptoms may include:   Dementia.  Personality and mood changes.  Difficulty walking.  Heart failure.  Fainting.  Enlargement (aneurysm) of the aorta.  Tumors of the skin, bones, or liver.  Muscle weakness.  Sudden "lightning" pains, numbness, or tingling.  Problems with coordination.  Vision changes. DIAGNOSIS   A physical exam will be done.  Blood tests will be done to confirm the diagnosis.  If the disease is in the first or second stages, a fluid (drainage) sample from a sore or rash may be examined under a microscope to detect the disease-causing bacteria.  Fluid around the spine may need to be examined to detect brain damage or inflammation of the brain lining (meningitis).  If the disease is in the third stage, X-rays, CT scans, MRIs, echocardiograms, ultrasounds, or cardiac  catheterization may also be done to detect disease of the heart, aorta, or brain. TREATMENT  Syphilis can be cured with antibiotic medicine if a diagnosis is made early. During the first day of treatment, you may experience fever, chills, headache, nausea, or aching all over your body. This is a normal reaction to the antibiotics.  HOME CARE INSTRUCTIONS   Take your antibiotic medicine as directed by your health care provider. Finish the antibiotic even if you start to feel better. Incomplete treatment will put you at risk for continued infection and could be life threatening.  Take medicines only as directed by your health care provider.  Do not have sexual intercourse until your treatment is completed or as directed by your health care provider.  Inform your recent sexual partners that you were diagnosed with syphilis. They need to seek care and treatment, even if they have no symptoms. It is necessary that all your sexual partners be tested for infection and treated if they have the disease.  Keep all follow-up visits as directed by your health care provider. It is important to keep all your appointments.  If your test results are not ready during your visit, make an appointment with your health care provider to find out the results. Do not assume everything is normal if you have not heard from your health care provider or the medical facility. It is your responsibility to get your test results. SEEK MEDICAL CARE IF:  You continue to have any of the following 24 hours after beginning treatment:  Fever.  Chills.  Headache.  Nausea.  Aching all over your body.  You have symptoms of an allergic reaction to medicine, such as:  Chills.  A headache.  Light-headedness.  A new rash (especially hives).  Difficulty breathing. MAKE SURE YOU:   Understand these instructions.  Will watch your condition.  Will get help right away if you are not doing well or get worse. Document  Released: 08/23/2013 Document Revised: 03/19/2014 Document Reviewed: 08/23/2013 [facility] Patient Information 2015 [place], [place]. This information is not intended to replace advice given to you by your health care provider. Make sure you discuss any questions you have with your health care provider.   Psychosis Psychosis refers to a severe lack of understanding with reality. During a psychotic episode, you are not able to think clearly. During a psychotic episode, your responses and emotions are inappropriate and do not coincide with what is actually happening. You often have false beliefs about what is happening or who you are (delusions), and you may see, hear, taste, smell, or feel things that are not present (hallucinations). Psychosis is usually a severe symptom of a very serious mental health (psychiatric) condition, but it can sometimes be the result of a medical condition. CAUSES   Psychiatric conditions, such as:  Schizophrenia.  Bipolar disorder.  Depression.  Personality disorders.  Alcohol or drug abuse.  Medical conditions, such as:  Brain injury.  Brain tumor.  Dementia.  Brain diseases, such as Alzheimer's, Parkinson's, or Huntington's disease.  Neurological diseases, such as epilepsy.  Genetic disorders.  Metabolic disorders.  Infections that affect the brain.  Certain prescription drugs.  Stroke. SYMPTOMS   Unable to think or speak clearly or respond appropriately.  Disorganized thinking (thoughts jump from one thought to another).  Severe inappropriate behavior.  Delusions may include:  A strong belief that is odd, unrealistic, or false.  Feeling extremely fearful or suspicious (paranoid).  Believing you are someone else, have high importance, or have an altered identity.  Hallucinations. DIAGNOSIS   Mental health evaluation.  Physical exam.  Blood tests.  Computerized magnetic scan (MRI) or other brain scans. TREATMENT  Your  caregiver will recommend a course of treatment that depends on the cause of the psychosis. Treatment may include:  Monitoring and supportive care in the hospital.  Taking medicines (antipsychotic medicine) to reduce symptoms and balance chemicals in the brain.  Taking medicines to manage underlying mental health conditions.  Therapy and other supportive programs outside of the hospital.  Treating an underlying medical condition. If the cause of the psychosis can be treated or corrected, the outlook is good. Without treatment, psychotic episodes can cause danger to yourself or others. Treatment may be short-term or lifelong. HOME CARE INSTRUCTIONS   Take all medicines as directed. This is important.  Use a pillbox or write down your medicine schedule to make sure you are taking them.  Check with your caregiver before using over-the-counter medicines, herbs, or supplements.  Seek individual and family support through therapy and mental health education (psychoeducation) programs. These will help you manage symptoms and side effects of medicines, learn life skills, and maintain a healthy routine.  Maintain a healthy lifestyle.  Exercise regularly.  Avoid alcohol and drugs.  Learn ways to reduce stress and cope with stress, such as yoga and meditation.  Talk about your feelings with family members or caregivers.  Make time for yourself to do things you enjoy.  Know the early warning signs of psychosis. Your caregiver will recommend steps to take when you notice  symptoms such as:  Feeling anxious or preoccupied.  Having racing thoughts.  Changes in your interest in life and relationships.  Follow up with your caregivers for continued outpatient treatment as directed. SEEK MEDICAL CARE IF:   Medicines do not seem to be helping.  You hear voices telling you to do things.  You see, smell, or feel things that are not there.  You feel hopeless and overwhelmed.  You feel  extremely fearful and suspicious that something will harm you.  You feel like you cannot leave your house.  You have trouble taking care of yourself.  You experience side effects of medicines, such as changes in sleep patterns, dizziness, weight gain, restlessness, movement changes, muscle spasms, or tremors. SEEK IMMEDIATE MEDICAL CARE IF:  Severe psychotic symptoms present a safety issue (such as an urge to hurt yourself or others). MAKE SURE YOU:   Understand these instructions.  Will watch your condition.  Will get help right away if you are not doing well or get worse. FOR MORE INFORMATION  National Institute of Mental Health: http://www.maynard.net/ Document Released: 04/22/2010 Document Revised: 01/25/2012 Document Reviewed: 04/22/2010 [facility] Patient Information 2015 [place], [place]. This information is not intended to replace advice given to you by your health care provider. Make sure you discuss any questions you have with your health care provider.

## 2015-06-13 NOTE — Care Management (Signed)
Spoke with Ronny Bacon [phone] at infectious disease concerning resources available for help with medication funding. She directed me to Winfield Rast at [facility] department STD/HIV coordinator. [phone] Informed by CSW that medication cost would be $900 per day at SNF.  Informed by Barbara Cower at [facility] that medication would be $952.28 for 10 day regimen if patient goes home with home health. [employer] foundation could pay this according to [employer]. WOuld like to see if other resource available before using charitable foundation. Family members state that they are unable to take patient home at this time. CSW is seeking LOG placement. More to follow.

## 2015-06-13 NOTE — Progress Notes (Signed)
Notified Dr. Sherryll Burger that bp is 96/47, MD is aware, no new orders, pt is asymptomatic. Dr Sherryll Burger via telephone

## 2015-06-13 NOTE — Clinical Social Work Note (Signed)
As a side note, Thayer Ohm at [facility] discussed patient case with their pharmacy, [facility], who would provide the IV medication and the total cost is going to be $569.88 for the total remainder of days. Dr. Sampson Goon had written for a one time IM PCN dose at end of course of IV ABX and CSW spoke with Sampson Goon about this and if the facility is not able to obtain the IM dose, then the patient can be taken to the health department and given it. Thayer Ohm at [facility] is aware. York Spaniel MSW,LCSW [phone]

## 2015-06-13 NOTE — Clinical Social Work Note (Signed)
After much communication, [facility] is able to accept patient for her continued intravenous pcn-g and physician is going to discharge patient today. CSW has spoken to patient's daughter, Cleon Gustin, and she is in agreement and aware of discharge today. [facility] to [facility]. Discharge summary to be sent to [facility]. York Spaniel MSW,LCSW [phone]

## 2015-06-13 NOTE — Care Management (Signed)
Spoke with Aris Georgia [phone] at [facility] department who will investigate financial option for patient medication and call me back.

## 2015-07-10 ENCOUNTER — Encounter: Payer: Self-pay | Admitting: Infectious Diseases

## 2015-08-06 NOTE — Discharge Summary (Signed)
Physician Discharge Summary Note  Patient:  Autumn Pruitt is an 64 y.o., female MRN:  [id number] DOB:  1950-12-27 Patient phone:  [phone] (home)  Patient address:   [address] [place] [address],  Total Time spent with patient: 30 minutes  Date of Admission:  06/04/2015 Date of Discharge: 06/07/2015  Reason for Admission:  Altered mental status.  Identifying data. Autumn Pruitt is a 64 year old female with history of psychosis.  Chief complaint. The patient unable to state.  History of present illness. Autumn Pruitt suffered a stroke. Following her stroke she developed mild cognitive decline and some psychotic symptoms that were addressed with low-dose Risperdal. 10 days ago as she was seen in the emergency room and diagnosed with UTI. She was prescribed Keflex for 10 days. We note that she wanted at the pharmacy we are not certain if she truly took it. She returned to the emergency room reportedly psychotic. Per family report the patient was agitated screaming and yelling at her neighbors and was brought to the hospital. The patient herself is very disorganized and unable to answer any questions. She is paranoid and delusional she believes that Autumn Pruitt is in her hair and has been washing her hair continuously. She believes that her fianc left her yesterday. She is oriented to person and place only. She is unable to tell me about her depression, anxiety, or substance use. Her drug screen was negative for substances We are not certain if she were compliant with medications.  Past psychiatric history. None until this stroke. Apparently her symptoms were well controlled with low-dose Risperdal.  Family psychiatric history. Nonreported.  Social history. Apparently she lives independently her daughter checks on her daily.  Past Medical History:  Past Medical History  Diagnosis Date         Principal Problem: Psychosis, organic Discharge Diagnoses: Patient Active Problem List    Diagnosis Date Noted  . 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 UTI (urinary tract infection) [N39.0] 06/05/2015  . 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 Stroke [I63.9] 06/03/2015    Musculoskeletal: Strength & Muscle Tone: within normal limits Gait & Station: normal Patient leans: N/A  Psychiatric Specialty Exam: Physical Exam  Nursing note and vitals reviewed.   Review of Systems  All other systems reviewed and are negative.   Blood pressure 125/77, pulse 121, temperature 97.7 F (36.5 C), temperature source Oral, resp. rate 18, height  (1.676 m), weight 51.71 kg (114 lb), SpO2 98 %.Body mass index is 18.41 kg/(m^2).  See SRA.                                                  Sleep:  Number of Hours: 3   Have you used any form of tobacco in the last 30 days? (Cigarettes, Smokeless Tobacco, Cigars, and/or Pipes): No  Has this patient used any form of tobacco in the last 30 days? (Cigarettes, Smokeless Tobacco, Cigars, and/or Pipes) No  Past Medical History:  Past Medical History  Diagnosis Date  . CVA (cerebral infarction)   . Stroke     Past Surgical History  Procedure Laterality Date  . Tubal ligation     Family History:  Family History  Problem Relation Age of Onset  . Cancer Father    Social History:  History  Alcohol Use No     History  Drug  Use No    Social History   Social History  . Marital Status: Widowed    Spouse Name: N/A  . Number of Children: N/A  . Years of Education: N/A   Social History Main Topics  . Smoking status: Never Smoker   . Smokeless tobacco: Never Used  . Alcohol Use: No  . Drug Use: No  . Sexual Activity: Not Currently   Other Topics Concern  . None   Social History Narrative    Past Psychiatric History: Hospitalizations:  Outpatient Care:  Substance Abuse Care:  Self-Mutilation:  Suicidal Attempts:  Violent Behaviors:   Risk to Self: Is patient at risk for suicide?: No Risk to  Others:   Prior Inpatient Therapy:   Prior Outpatient Therapy:    Level of Care:  [facility]  Hospital Course:   Autumn Pruitt is a 64 year old female with history of cognitive decline and psychosis she developed following her stroke. She was admitted for worsening psychosis, disorganized, erratic behavior and agitation possibly in the context of recent UTI.  1. Agitation. This has resolved.   2. Psychosis. The patient was maintained on low-dose Risperdal at home. Risperdal was increased to 2 mg twice daily to address psychotic symptoms. We added cogentin for slight stiffness. We started Remeron for depression.  3. Insomnia. She did not respond well to trazodone and was switched to Ambien.  4. Stroke prevention. We continue aspirin.  5. UTI. The patient was seen in the emergency room 10 days ago. She was prescribed Keflex. We note that she pick it up from the pharmacy. She returned to the emergency room with symptoms of UTI again. Keflex was discontinued and she was given a single dose of fosfomycin. Urine culture pending.  6. Positive RPR. The patient was diagnosed with neurosyphilis and transferred to medical floor. ID consult is greatly appreciated.  7. Low B12. We started B12 injections for 3 days followed by oral supplementation.   8. Disposition. She was transferred to medical floor for further treatment of neurosyphilis   Consults:  ID  Significant Diagnostic Studies:  None  Discharge Vitals:   Blood pressure 125/77, pulse 121, temperature 97.7 F (36.5 C), temperature source Oral, resp. rate 18, height  (1.676 m), weight 51.71 kg (114 lb), SpO2 98 %. Body mass index is 18.41 kg/(m^2). Lab Results:   No results found for this or any previous visit (from the past 72 hour(s)).  Physical Findings: AIMS:  , ,  ,  ,    CIWA:    COWS:      See Psychiatric Specialty Exam and Suicide Risk Assessment completed by Attending Physician prior to discharge.  Discharge destination:   Other:  medical floor ar [facility].  Is patient on multiple antipsychotic therapies at discharge:  No   Has Patient had three or more failed trials of antipsychotic monotherapy by history:  No    Recommended Plan for Multiple Antipsychotic Therapies: NA     Medication List    ASK your doctor about these medications      Indication   aspirin EC 81 MG tablet  Take 1 tablet (81 mg total) by mouth daily.      risperiDONE 1 MG tablet  Commonly known as:  RISPERDAL  Take 0.5 mg by mouth 2 (two) times daily.      traZODone 50 MG tablet  Commonly known as:  DESYREL  Take 1 tablet (50 mg total) by mouth at bedtime.  Follow-up recommendations:  Activity:  As tolerated. Diet:  Low sodium heart healthy. Other:  P follow-up appointments.  Comments:    Total Discharge Time: 35 min.  Signed: Kristine Linea 08/06/2015, 3:27 PM

## 2015-08-06 NOTE — [facility] Suicide Risk Assessment (Signed)
[facility] Discharge Suicide Risk Assessment   Demographic Factors:  Divorced or widowed and Living alone  Total Time spent with patient: 30 minutes  Musculoskeletal: Strength & Muscle Tone: within normal limits Gait & Station: normal Patient leans: N/A  Psychiatric Specialty Exam: Physical Exam  Nursing note and vitals reviewed.   Review of Systems  All other systems reviewed and are negative.   Blood pressure 125/77, pulse 121, temperature 97.7 F (36.5 C), temperature source Oral, resp. rate 18, height  (1.676 m), weight 51.71 kg (114 lb), SpO2 98 %.Body mass index is 18.41 kg/(m^2).  General Appearance: Casual  Eye Contact::  Fair  Speech:  Clear and Coherent409  Volume:  Normal  Mood:  Anxious  Affect:  Appropriate  Thought Process:  Goal Directed  Orientation:  Full (Time, Place, and Person)  Thought Content:  Hallucinations: Auditory  Suicidal Thoughts:  No  Homicidal Thoughts:  No  Memory:  Immediate;   Poor Recent;   Poor Remote;   Poor  Judgement:  Impaired  Insight:  Lacking  Psychomotor Activity:  Normal  Concentration:  Fair  Recall:  Poor  Fund of Knowledge:Fair  Language: Fair  Akathisia:  No  Handed:  Right  AIMS (if indicated):     Assets:  Communication Skills Desire for Improvement Financial Resources/Insurance Housing Physical Health Social Support  Sleep:  Number of Hours: 3  Cognition: WNL  ADL's:  Intact   Have you used any form of tobacco in the last 30 days? (Cigarettes, Smokeless Tobacco, Cigars, and/or Pipes): No  Has this patient used any form of tobacco in the last 30 days? (Cigarettes, Smokeless Tobacco, Cigars, and/or Pipes) No  Mental Status Per Nursing Assessment::   On Admission:  NA  Current Mental Status by Physician: NA  Loss Factors: Decline in physical health  Historical Factors: NA  Risk Reduction Factors:   Sense of responsibility to family and Positive social support  Continued Clinical Symptoms:   Medical Diagnoses and Treatments/Surgeries  Cognitive Features That Contribute To Risk:  None    Suicide Risk:  Minimal: No identifiable suicidal ideation.  Patients presenting with no risk factors but with morbid ruminations; may be classified as minimal risk based on the severity of the depressive symptoms  Principal Problem: Psychosis, organic Discharge Diagnoses:  Patient Active Problem List   Diagnosis Date Noted  . 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 UTI (urinary tract infection) [N39.0] 06/05/2015  . 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 Stroke [I63.9] 06/03/2015      Plan Of Care/Follow-up recommendations:  Activity:  As tolerated. Diet:  Low sodium heart healthy. Other:  Keep follow-up appointments.  Is patient on multiple antipsychotic therapies at discharge:  No   Has Patient had three or more failed trials of antipsychotic monotherapy by history:  No  Recommended Plan for Multiple Antipsychotic Therapies: NA    Jolanta Pucilowska 08/06/2015, 3:47 PM

## 2015-11-13 IMAGING — CT CT HEAD W/O CM
1 series · 16 of 30 positions shown, 20 images · non-contrast
Comparison: MRI brain 10/18/2013 and head CT 10/18/2013

CLINICAL DATA: Insomnia and altered mental status over the past few
days. Remote CVA history.

EXAM:
CT HEAD WITHOUT CONTRAST
TECHNIQUE: Contiguous axial images were obtained from the base of the skull
through the vertex without intravenous contrast.

[Series 2: head wo · axial · 0.42mm/px · z∈[-95,+33]mm · 16 of 30 slices shown, 20 images]
[im 2/30  brain]
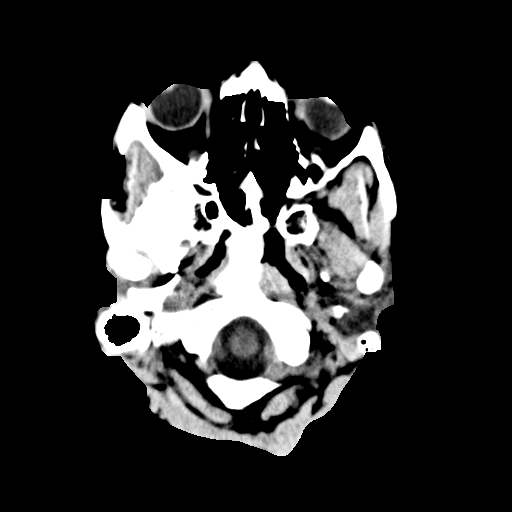
[im 2/30  bone]
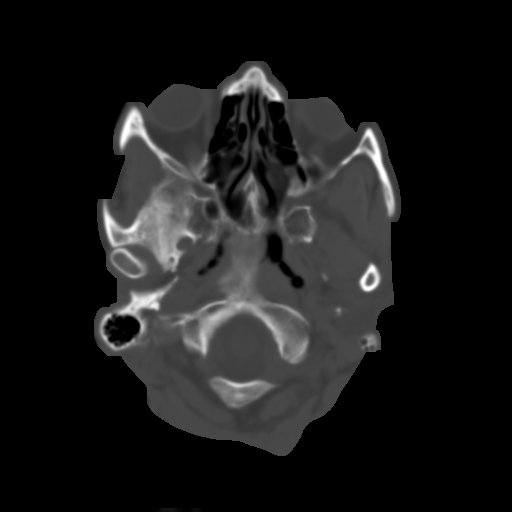
[im 4/30  brain]
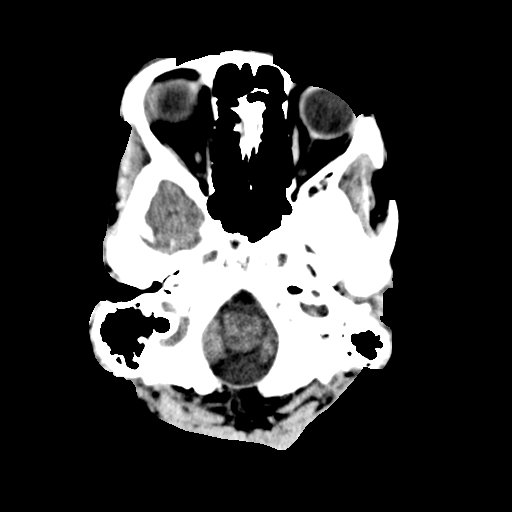
[im 6/30  brain]
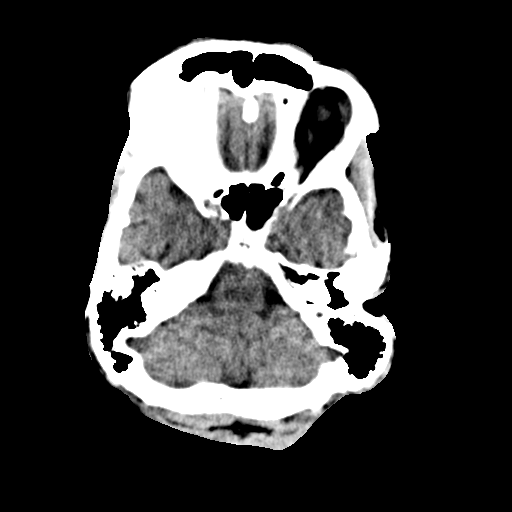
[im 8/30  brain]
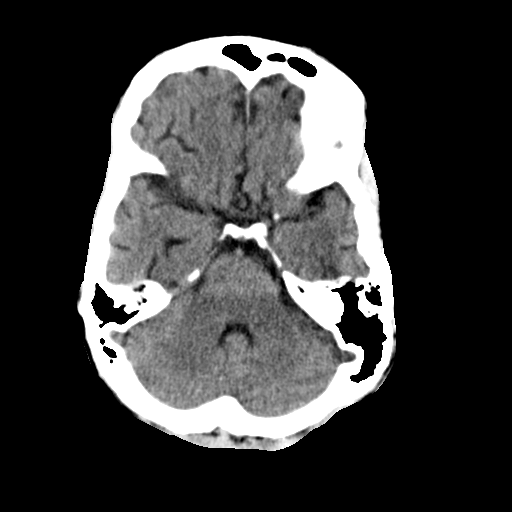
[im 9/30  brain]
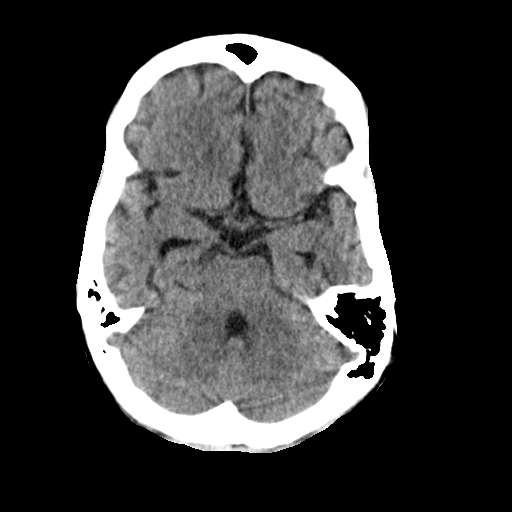
[im 9/30  bone]
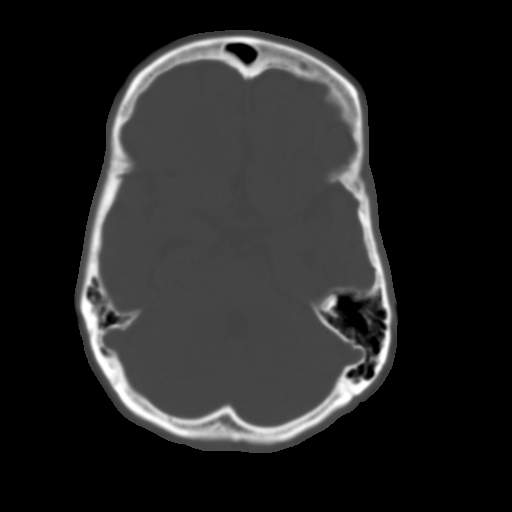
[im 11/30  brain]
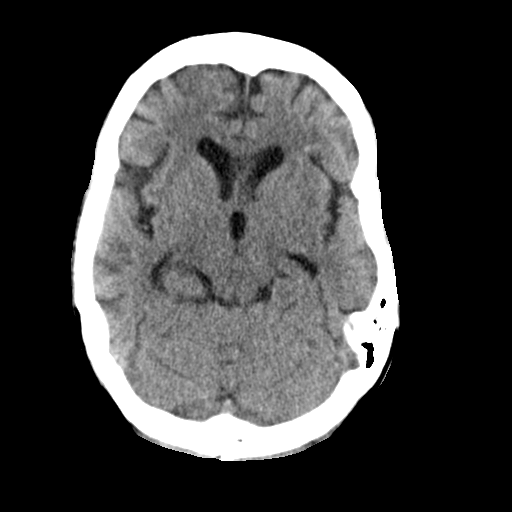
[im 13/30  brain]
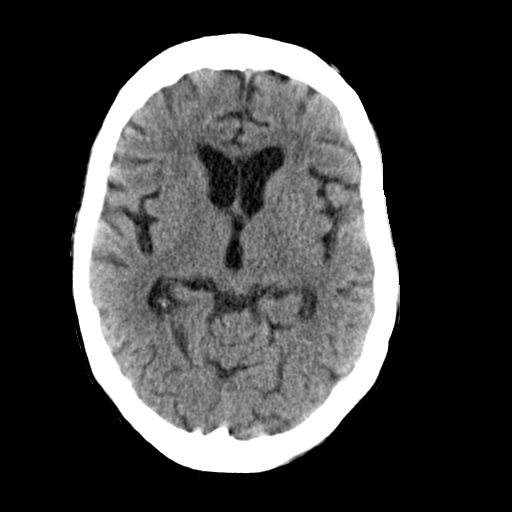
[im 15/30  brain]
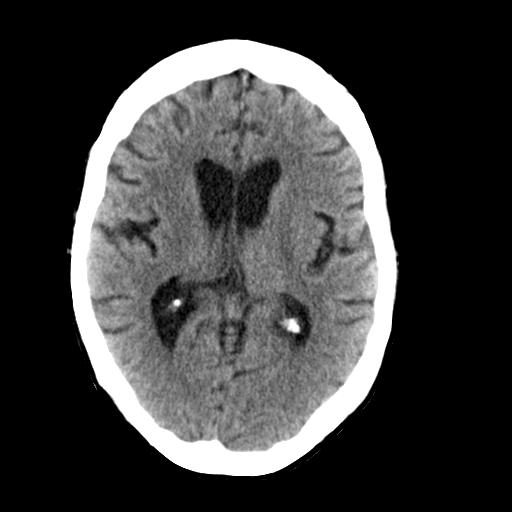
[im 16/30  brain]
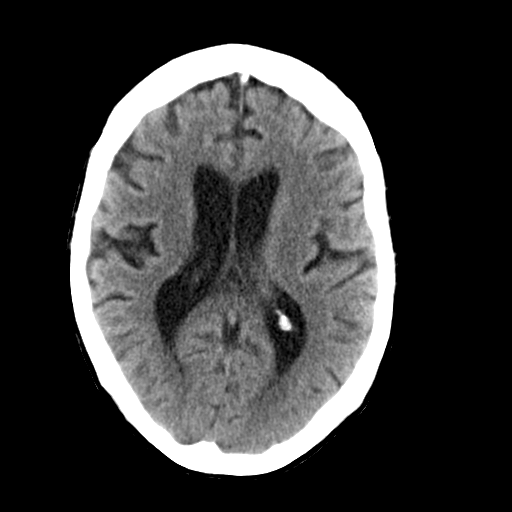
[im 16/30  bone]
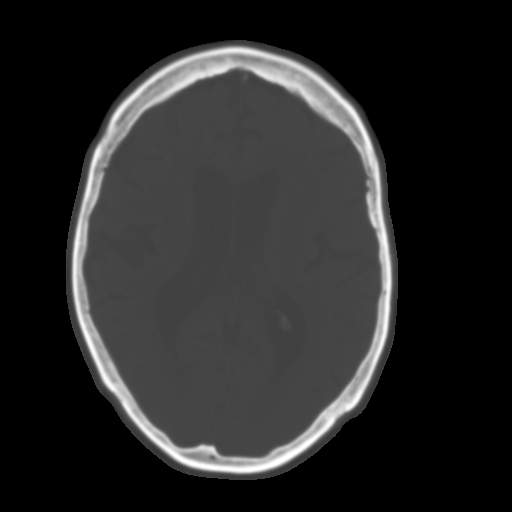
[im 18/30  brain]
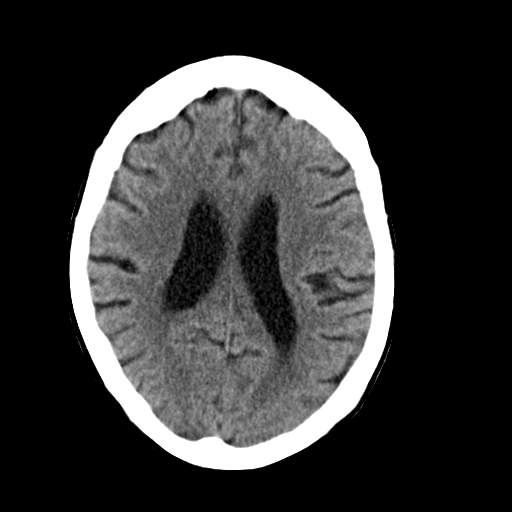
[im 20/30  brain]
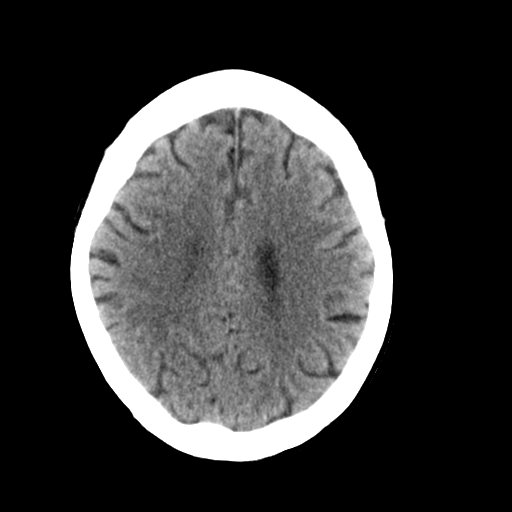
[im 22/30  brain]
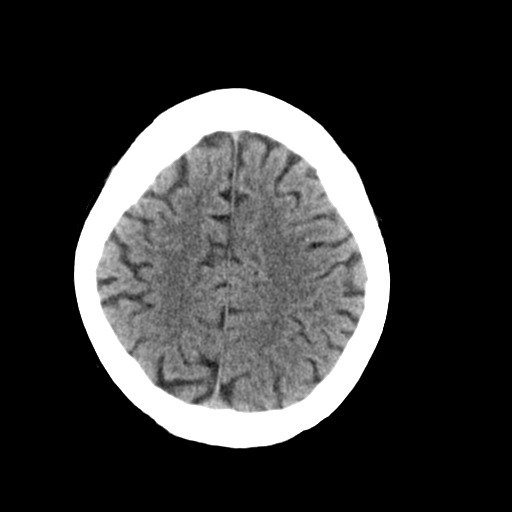
[im 23/30  brain]
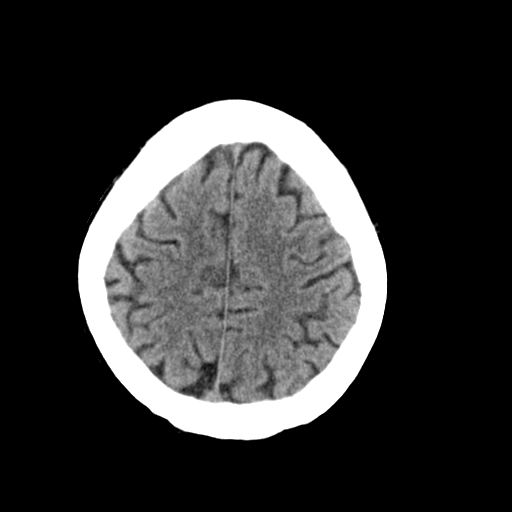
[im 23/30  bone]
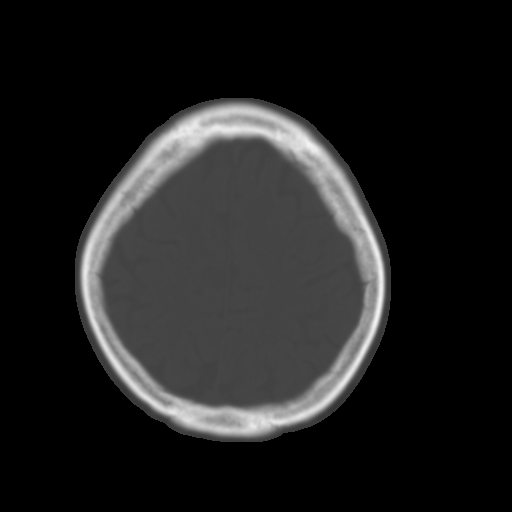
[im 25/30  brain]
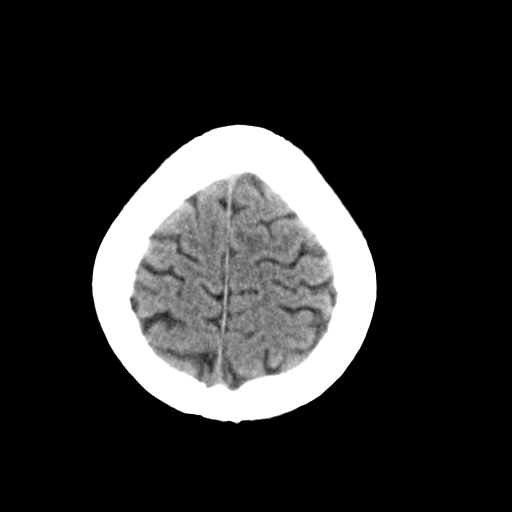
[im 27/30  brain]
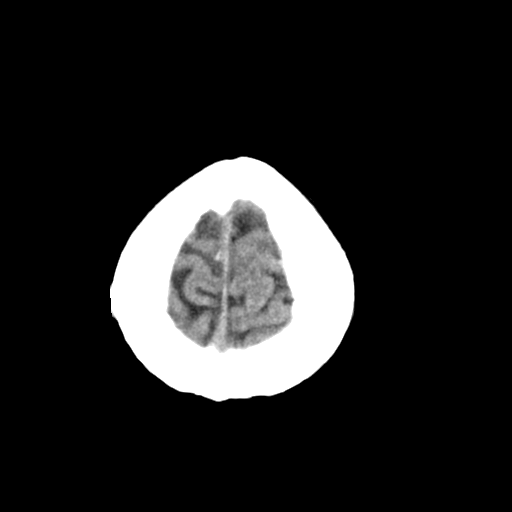
[im 29/30  brain]
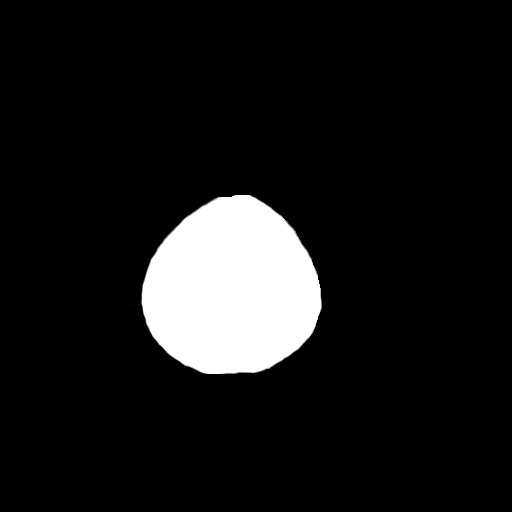

[16 of 30 positions shown; findings below may reference images not displayed]

FINDINGS: The ventricles are normal in size and configuration. No extra-axial
fluid collections are identified. The gray-white differentiation is
normal. No CT findings for acute intracranial process such as
hemorrhage or infarction. No mass lesions. The brainstem and
cerebellum are grossly normal.

The bony structures are intact. The paranasal sinuses and mastoid
air cells are clear. The globes are intact. Stable calcified scalp
lesion at the right vertex.
IMPRESSION: No acute intracranial findings or mass lesions.

## 2015-12-01 IMAGING — CR DG CHEST 1V PORT
1 series · 1 of 1 positions shown · non-contrast
Comparison: None.

CLINICAL DATA: PICC placement

EXAM:
PORTABLE CHEST - 1 VIEW

[ap]
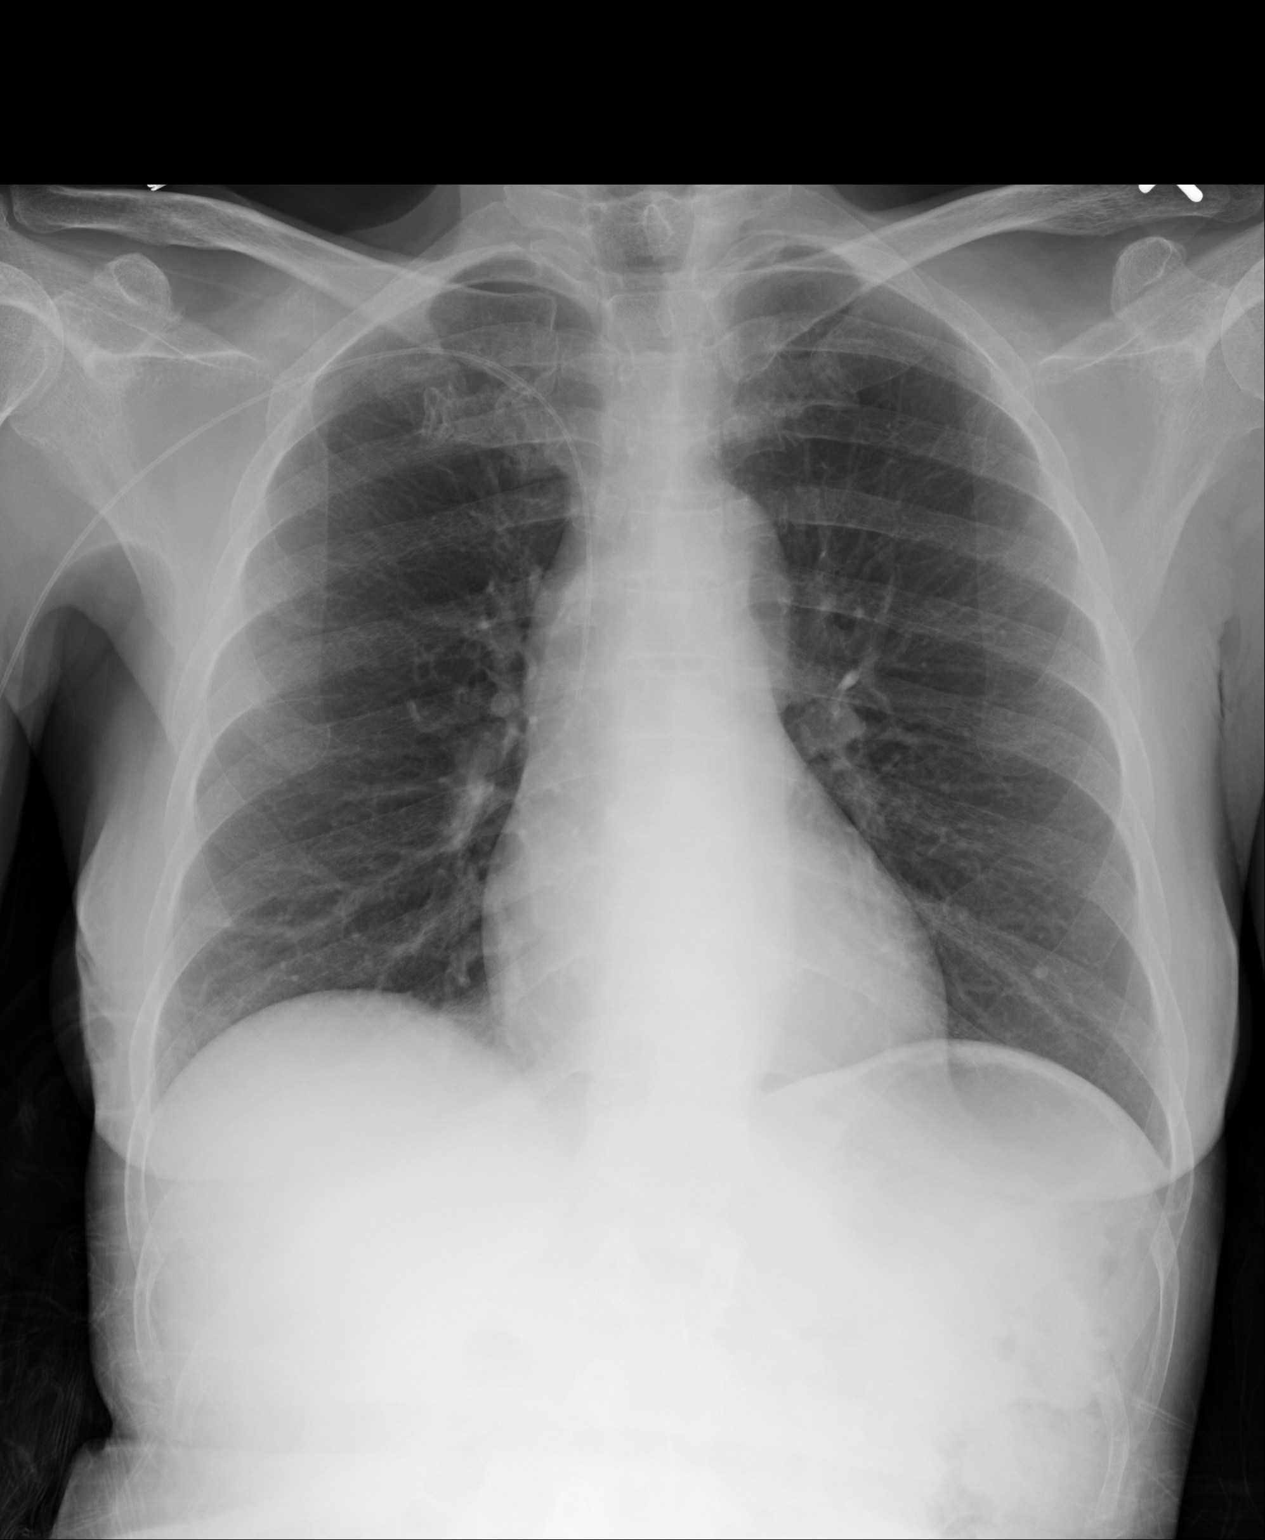

[1 of 1 positions shown; findings below may reference images not displayed]

FINDINGS: The right upper extremity PICC line extends into the SVC, about 2 cm
below the azygos vein junction.

The lungs are clear. Hilar and mediastinal contours are
unremarkable.
IMPRESSION: Satisfactorily positioned right upper extremity PICC line.

## 2018-05-14 ENCOUNTER — Emergency Department
Admission: EM | Admit: 2018-05-14 | Discharge: 2018-05-14 | Disposition: A | Discharge status: Home / Self Care | Payer: Self-pay | Attending: Emergency Medicine | Admitting: Emergency Medicine

## 2018-05-14 ENCOUNTER — Encounter: Payer: Self-pay | Admitting: Medical Oncology

## 2018-05-14 DIAGNOSIS — L02213 Cutaneous abscess of chest wall: Secondary | ICD-10-CM | POA: Insufficient documentation

## 2018-05-14 DIAGNOSIS — Z79899 Other long term (current) drug therapy: Secondary | ICD-10-CM | POA: Insufficient documentation

## 2018-05-14 MED ORDER — LIDOCAINE HCL (PF) 1 % IJ SOLN
INTRAMUSCULAR | Status: AC
  Administered 2018-05-14: 5 mL
  Filled 2018-05-14: qty 5

## 2018-05-14 MED ORDER — LIDOCAINE HCL (PF) 1 % IJ SOLN
5.0000 mL | Freq: Once | INTRAMUSCULAR | Status: AC
  Administered 2018-05-14: 5 mL

## 2018-05-14 MED ORDER — SULFAMETHOXAZOLE-TRIMETHOPRIM 800-160 MG PO TABS: 1.0000 | ORAL_TABLET | Freq: Two times a day (BID) | ORAL | 0 refills | Status: DC

## 2018-05-14 MED ORDER — HYDROCODONE-ACETAMINOPHEN 5-325 MG PO TABS: 1.0000 | ORAL_TABLET | Freq: Four times a day (QID) | ORAL | 0 refills | Status: DC | PRN

## 2018-05-14 NOTE — ED Triage Notes (Signed)
Pt reports abscess under left breast for a few days. Area red, not draining.

## 2018-05-14 NOTE — Discharge Instructions (Addendum)
Begin taking antibiotic today twice a day for the next 10 days.  You also have a prescription for pain if needed.  Norco is 1 every 6 hours if needed for pain.  Be aware that this could cause drowsiness and increase your risk for falling.  Return to the emergency department in 2 days for drain removal or [facility].

## 2018-05-14 NOTE — ED Provider Notes (Signed)
[facility] Emergency Department Provider Note  ____________________________________________   First MD Initiated Contact with Patient 05/14/18 [phone]     (approximate)  I have reviewed the triage vital signs and the nursing notes.   HISTORY  Chief Complaint Abscess  HPI Autumn Pruitt is a 67 y.o. female is here with complaint of abscess just under her left breast for several days.  Patient is red but has not been draining.  She states that she had one abscess "a long time ago".  Caregiver with the patient is not aware of any fever.  She rates her pain as a 5/10.   Past Medical History:  Diagnosis Date  . CVA (cerebral infarction)   . Stroke [facility](HCC)     Patient Active Problem List   Diagnosis Date Noted  . Neurosyphilis 06/07/2015  . UTI (urinary tract infection) 06/05/2015  . 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 Stroke [facility](HCC) 06/03/2015    Past Surgical History:  Procedure Laterality Date  . TUBAL LIGATION      Prior to Admission medications   Medication Sig Start Date End Date Taking? Authorizing Provider  feeding supplement, ENSURE ENLIVE, (ENSURE ENLIVE) LIQD Take 237 mLs by mouth 2 (two) times daily between meals. 06/13/15   Delfino LovettShah, Vipul, MD  HYDROcodone-acetaminophen (NORCO/VICODIN) 5-325 MG tablet Take 1 tablet by mouth every 6 (six) hours as needed for moderate pain. 05/14/18   Tommi RumpsSummers, Alice Burnside L, PA-C  penicillin g benzathine (BICILLIN LA) 1200000 UNIT/2ML SUSP injection Inject 2 mLs (1.2 Million Units total) into the muscle once. Give 1.2 MU in each buttock after finishing 1 week course of Penicillin G 06/13/15   Delfino LovettShah, Vipul, MD  risperiDONE (RISPERDAL) 1 MG tablet Take 0.5 mg by mouth 2 (two) times daily.    [provider]  sulfamethoxazole-trimethoprim (BACTRIM DS,SEPTRA DS) 800-160 MG tablet Take 1 tablet by mouth 2 (two) times daily. 05/14/18   Tommi RumpsSummers, Karris Deangelo L, PA-C  traZODone (DESYREL) 50 MG tablet Take 1 tablet (50 mg total) by mouth  at bedtime. 05/25/15   Loleta RoseForbach, Cory, MD    Allergies Patient has no known allergies.  Family History  Problem Relation Age of Onset  . Cancer Father     Social History Social History   Tobacco Use  . Smoking status: Never Smoker  . Smokeless tobacco: Never Used  Substance Use Topics  . Alcohol use: No  . Drug use: No    Review of Systems Constitutional: No fever/chills Cardiovascular: Denies chest pain. Respiratory: Denies shortness of breath. Musculoskeletal: Negative for back pain. Skin: Positive for abscess. Neurological: Negative for  focal weakness or numbness. ____________________________________________   PHYSICAL EXAM:  VITAL SIGNS: ED Triage Vitals [05/14/18 0807]  Enc Vitals Group     BP 136/60     Pulse Rate 86     Resp 16     Temp 98.2 F (36.8 C)     Temp Source Oral     SpO2 100 %     Weight 130 lb (59 kg)     Height 5\' 4"  (1.626 m)     Head Circumference      Peak Flow      Pain Score 5     Pain Loc      Pain Edu?      Excl. in [place]?    Constitutional: Alert and oriented. Well appearing and in no acute distress. Eyes: Conjunctivae are normal.  Head: Atraumatic. Neck: No stridor.   Cardiovascular: Normal rate, regular rhythm.  Grossly normal heart sounds.  Good peripheral circulation. Respiratory: Normal respiratory effort.  No retractions. Lungs CTAB. Musculoskeletal: Moves upper and lower extremities with any difficulty normal gait was noted. Neurologic:  Normal speech and language. No gross focal neurologic deficits are appreciated.  Skin:  Skin is warm, dry.  There is a soft fluctuant 3 cm area to the left chest wall just beneath the left breast.  No active drainage is noted.  Area is tender to light touch.  Area is erythematous with out extending cellulitis. Psychiatric: Mood and affect are normal. Speech and behavior are normal.  ____________________________________________   LABS (all labs ordered are listed, but only abnormal  results are displayed)  Labs Reviewed - No data to display  PROCEDURES  Procedure(s) performed:   Marland KitchenMarland KitchenIncision and Drainage Date/Time: 05/14/2018 9:00 AM Performed by: Tommi Rumps, PA-C Authorized by: Tommi Rumps, PA-C   Consent:    Consent obtained:  Verbal   Consent given by:  Patient   Risks discussed:  Pain and infection Location:    Type:  Abscess   Location:  Trunk   Trunk location:  Chest Pre-procedure details:    Skin preparation:  Antiseptic wash Anesthesia (see MAR for exact dosages):    Anesthesia method:  Local infiltration   Local anesthetic:  Lidocaine 1% w/o epi Procedure type:    Complexity:  Simple Procedure details:    Needle aspiration: no     Incision types:  Stab incision   Incision depth:  Dermal   Scalpel blade:  11   Wound management:  Probed and deloculated   Drainage:  Purulent   Drainage amount:  Moderate   Wound treatment:  Drain placed   Packing materials:  1/4 in iodoform gauze Post-procedure details:    Patient tolerance of procedure:  Tolerated well, no immediate complications    Critical Care performed: No  ____________________________________________   INITIAL IMPRESSION / ASSESSMENT AND PLAN / ED COURSE  As part of my medical decision making, I reviewed the following data within the electronic MEDICAL RECORD NUMBER Notes from prior ED visits and [place] [facility] [place] Controlled Substance Database  Patient tolerated I&D extremely well.  Patient was discharged with a prescription for Bactrim DS twice daily for 10 days along with Norco if needed for moderate pain.  Patient and caregiver were made aware that the drain placed today would need to be removed in 2 days.  They may go to [facility] acute care or return to the emergency department for this.  ____________________________________________   FINAL CLINICAL IMPRESSION(S) / ED DIAGNOSES  Final diagnoses:  Cutaneous abscess of chest wall     ED Discharge Orders        Ordered     sulfamethoxazole-trimethoprim (BACTRIM DS,SEPTRA DS) 800-160 MG tablet  2 times daily     05/14/18 0920    HYDROcodone-acetaminophen (NORCO/VICODIN) 5-325 MG tablet  Every 6 hours PRN     05/14/18 0920       Note:  This document was prepared using Dragon voice recognition software and may include unintentional dictation errors.    Lilac, Hoff, PA-C 05/14/18 0981    Sharman Cheek, MD 05/14/18 1550

## 2019-08-06 ENCOUNTER — Encounter: Payer: Self-pay | Admitting: Emergency Medicine

## 2019-08-06 ENCOUNTER — Other Ambulatory Visit: Payer: Self-pay

## 2019-08-06 ENCOUNTER — Emergency Department
Admission: EM | Admit: 2019-08-06 | Discharge: 2019-08-06 | Disposition: A | Discharge status: Home / Self Care | Payer: Medicare Other | Attending: Emergency Medicine | Admitting: Emergency Medicine

## 2019-08-06 DIAGNOSIS — H02842 Edema of right lower eyelid: Secondary | ICD-10-CM | POA: Diagnosis not present

## 2019-08-06 DIAGNOSIS — R600 Localized edema: Secondary | ICD-10-CM

## 2019-08-06 DIAGNOSIS — H02845 Edema of left lower eyelid: Secondary | ICD-10-CM | POA: Diagnosis not present

## 2019-08-06 HISTORY — DX: Neurosyphilis, unspecified: A52.3

## 2019-08-06 MED ORDER — PREDNISONE 20 MG PO TABS
60.0000 mg | ORAL_TABLET | Freq: Once | ORAL | Status: AC
  Administered 2019-08-06: 60 mg via ORAL
  Filled 2019-08-06: qty 3

## 2019-08-06 NOTE — Discharge Instructions (Addendum)
You were seen today for periorbital edema. We gave you a dose of Prednisone. You can take Benadryl 25 mg every 8 hours as needed  for swelling. Please apply cool compresses 3 x day for swelling. Follow up if symptoms persist or worsen.

## 2019-08-06 NOTE — ED Notes (Signed)
See triage note  Presents with swelling to both eyes this am  Denies any trauma  Afebrile on arrival

## 2019-08-06 NOTE — ED Provider Notes (Signed)
[facility] Emergency Department Provider Note ____________________________________________  Time seen: 1122  I have reviewed the triage vital signs and the nursing notes.  HISTORY  Chief Complaint  Eye Problem   HPI Autumn Pruitt is a 68 y.o. female presents to the clinic today with swelling under bilateral eye lids. She reports she noticed this when she woke up this morning. She also reports her right eye looked black this morning, but that has resolved. She denies eye pain, itching, irritation, redness, discharge or visual changes. She denies headache, dizziness, runny nose, nasal congestion, ear pain or sore throat. She denies recent changes in diet or medication. She denies any facial trauma. She has not tried anything OTC for this.  Past Medical History:  Diagnosis Date  . CVA (cerebral infarction)   . Neurosyphilis   . Stroke [facility])     Patient Active Problem List   Diagnosis Date Noted  . Neurosyphilis 06/07/2015  . UTI (urinary tract infection) 06/05/2015  . 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 Stroke [facility]) 06/03/2015    Past Surgical History:  Procedure Laterality Date  . TUBAL LIGATION      Prior to Admission medications   Not on File    Allergies Patient has no known allergies.  Family History  Problem Relation Age of Onset  . Cancer Father     Social History Social History   Tobacco Use  . Smoking status: Never Smoker  . Smokeless tobacco: Never Used  Substance Use Topics  . Alcohol use: No  . Drug use: No    Review of Systems  Constitutional: Negative for fever, chills or body aches. Eyes: Positive for swelling under bilateral eyelids. Negative for visual changes. ENT: Negative for runny nose, nasal congestion, ear pain or sore throat. Respiratory: Negative for shortness of breath. Gastrointestinal: Negative for abdominal pain, vomiting and diarrhea. Skin: Negative for rash. Neurological: Negative for  headaches. ____________________________________________  PHYSICAL EXAM:  VITAL SIGNS: ED Triage Vitals  Enc Vitals Group     BP 08/06/19 1053 140/66     Pulse Rate 08/06/19 1053 75     Resp 08/06/19 1053 16     Temp 08/06/19 1053 98.4 F (36.9 C)     Temp Source 08/06/19 1053 Oral     SpO2 08/06/19 1053 98 %     Weight 08/06/19 1054 130 lb (59 kg)     Height 08/06/19 1054 5\' 5"  (1.651 m)     Head Circumference --      Peak Flow --      Pain Score 08/06/19 1058 0     Pain Loc --      Pain Edu? --      Excl. in [place]? --     Constitutional: Alert and oriented. Well appearing and in no distress. Head: Normocephalic and atraumatic. Eyes: Conjunctivae are normal. PERRL. Normal extraocular movements. Swelling noted of bilateral lower eyelids. Ears: Canals clear. TMs intact bilaterally. Nose: No congestion/rhinorrhea/epistaxis. Hematological/Lymphatic/Immunological: No cervical lymphadenopathy. Cardiovascular: Normal rate, regular rhythm.  Respiratory: Normal respiratory effort. No wheezes/rales/rhonchi. Neurologic:  Speech slurred (her baseline per her report). Skin:  Skin is warm, dry and intact. No rash noted.  ____________________________________________  INITIAL IMPRESSION / ASSESSMENT AND PLAN / ED COURSE  Periorbital Edema:  Prednisone 60 mg PO x 1 in ER Can take Benadryl 25 mg every 8 hours as needed Cool compresses 3 x day  Return precautions discussed ____________________________________________  FINAL CLINICAL IMPRESSION(S) / ED DIAGNOSES  Final diagnoses:  Periorbital  edema   Nicki Reaperegina Baity, NP    Lorre MunroeBaity, Regina W, NP 08/06/19 1155    Sharman CheekStafford, Phillip, MD 08/06/19 [phone]

## 2019-08-06 NOTE — ED Triage Notes (Signed)
Pt c/o eyes being swollen and black in the corner of her eyes when she woke up this morning.  No black noted in triage. No swelling noted around eyes.
